# Patient Record
Sex: Female | Born: 1935 | Race: White | Hispanic: No | Marital: Married | Smoking: Never smoker
Health system: Southern US, Community
[De-identification: ages and names within clinical notes are randomized; demographics above are authoritative.]

## PROBLEM LIST (undated history)

## (undated) DIAGNOSIS — F0390 Unspecified dementia without behavioral disturbance: Secondary | ICD-10-CM

## (undated) DIAGNOSIS — I639 Cerebral infarction, unspecified: Secondary | ICD-10-CM

## (undated) DIAGNOSIS — M1990 Unspecified osteoarthritis, unspecified site: Secondary | ICD-10-CM

## (undated) DIAGNOSIS — G459 Transient cerebral ischemic attack, unspecified: Secondary | ICD-10-CM

## (undated) DIAGNOSIS — E7800 Pure hypercholesterolemia, unspecified: Secondary | ICD-10-CM

## (undated) HISTORY — PX: APPENDECTOMY: SHX54

## (undated) HISTORY — DX: Pure hypercholesterolemia, unspecified: E78.00

## (undated) HISTORY — DX: Transient cerebral ischemic attack, unspecified: G45.9

---

## 2000-08-05 ENCOUNTER — Encounter: Payer: Self-pay | Admitting: *Deleted

## 2000-08-05 ENCOUNTER — Emergency Department (HOSPITAL_COMMUNITY): Admission: EM | Admit: 2000-08-05 | Discharge: 2000-08-06 | Payer: Self-pay | Admitting: *Deleted

## 2002-05-24 ENCOUNTER — Encounter: Payer: Self-pay | Admitting: Family Medicine

## 2002-05-24 ENCOUNTER — Ambulatory Visit (HOSPITAL_COMMUNITY): Admission: RE | Admit: 2002-05-24 | Discharge: 2002-05-24 | Payer: Self-pay | Admitting: Family Medicine

## 2003-06-27 ENCOUNTER — Ambulatory Visit (HOSPITAL_COMMUNITY): Admission: RE | Admit: 2003-06-27 | Discharge: 2003-06-27 | Payer: Self-pay | Admitting: Family Medicine

## 2003-12-06 ENCOUNTER — Ambulatory Visit (HOSPITAL_COMMUNITY): Admission: RE | Admit: 2003-12-06 | Discharge: 2003-12-06 | Payer: Self-pay | Admitting: Family Medicine

## 2004-01-23 ENCOUNTER — Ambulatory Visit (HOSPITAL_COMMUNITY): Admission: RE | Admit: 2004-01-23 | Discharge: 2004-01-23 | Payer: Self-pay | Admitting: Otolaryngology

## 2004-09-03 ENCOUNTER — Ambulatory Visit (HOSPITAL_COMMUNITY): Admission: RE | Admit: 2004-09-03 | Discharge: 2004-09-03 | Payer: Self-pay | Admitting: Family Medicine

## 2005-04-10 ENCOUNTER — Ambulatory Visit (HOSPITAL_COMMUNITY): Admission: RE | Admit: 2005-04-10 | Discharge: 2005-04-10 | Payer: Self-pay | Admitting: Family Medicine

## 2005-05-07 ENCOUNTER — Ambulatory Visit (HOSPITAL_COMMUNITY): Admission: RE | Admit: 2005-05-07 | Discharge: 2005-05-07 | Payer: Self-pay | Admitting: General Surgery

## 2006-03-20 ENCOUNTER — Ambulatory Visit (HOSPITAL_COMMUNITY): Admission: RE | Admit: 2006-03-20 | Discharge: 2006-03-20 | Payer: Self-pay | Admitting: Family Medicine

## 2006-03-21 ENCOUNTER — Ambulatory Visit (HOSPITAL_COMMUNITY): Admission: RE | Admit: 2006-03-21 | Discharge: 2006-03-21 | Payer: Self-pay | Admitting: Neurology

## 2007-03-31 ENCOUNTER — Encounter: Admission: RE | Admit: 2007-03-31 | Discharge: 2007-03-31 | Payer: Self-pay | Admitting: Neurology

## 2008-10-07 ENCOUNTER — Encounter: Admission: RE | Admit: 2008-10-07 | Discharge: 2008-10-07 | Payer: Self-pay | Admitting: Neurology

## 2009-05-01 ENCOUNTER — Ambulatory Visit (HOSPITAL_COMMUNITY): Admission: RE | Admit: 2009-05-01 | Discharge: 2009-05-01 | Payer: Self-pay | Admitting: Family Medicine

## 2009-06-02 ENCOUNTER — Emergency Department (HOSPITAL_COMMUNITY): Admission: EM | Admit: 2009-06-02 | Discharge: 2009-06-03 | Payer: Self-pay | Admitting: Internal Medicine

## 2010-04-22 ENCOUNTER — Encounter: Payer: Self-pay | Admitting: Family Medicine

## 2010-06-22 LAB — D-DIMER, QUANTITATIVE: D-Dimer, Quant: 0.37 ug/mL-FEU (ref 0.00–0.48)

## 2010-06-22 LAB — DIFFERENTIAL
Eosinophils Absolute: 0.2 10*3/uL (ref 0.0–0.7)
Eosinophils Relative: 1 % (ref 0–5)
Lymphs Abs: 2.8 10*3/uL (ref 0.7–4.0)
Monocytes Relative: 9 % (ref 3–12)
Neutro Abs: 9.4 10*3/uL — ABNORMAL HIGH (ref 1.7–7.7)
Neutrophils Relative %: 69 % (ref 43–77)

## 2010-06-22 LAB — CBC
MCHC: 34.7 g/dL (ref 30.0–36.0)
MCV: 99.8 fL (ref 78.0–100.0)
Platelets: 255 10*3/uL (ref 150–400)
RDW: 13.1 % (ref 11.5–15.5)

## 2010-06-22 LAB — BASIC METABOLIC PANEL
Chloride: 106 mEq/L (ref 96–112)
GFR calc non Af Amer: >60 mL/min (ref >60)
Glucose, Bld: 96 mg/dL (ref 70–99)
Sodium: 141 mEq/L (ref 135–145)

## 2010-06-22 LAB — PROTIME-INR
INR: 1.02 (ref 0.00–1.49)
Prothrombin Time: 13.3 seconds (ref 11.6–15.2)

## 2010-06-22 LAB — POCT CARDIAC MARKERS
CKMB, poc: <1 ng/mL — ABNORMAL LOW (ref 1.0–8.0)
Myoglobin, poc: 82.1 ng/mL (ref 12–200)
Troponin i, poc: <0.05 ng/mL (ref 0.00–0.09)

## 2010-08-17 NOTE — H&P (Signed)
[name], [name]               ACCOUNT NO.:  [id number]   MEDICAL RECORD NO.:  [id number]          PATIENT TYPE:  AMB   LOCATION:                                FACILITY:  [facility]   PHYSICIAN:  Dalia Heading, M.D.  DATE OF BIRTH:  1933/03/01   DATE OF ADMISSION:  DATE OF DISCHARGE:  LH                                HISTORY & PHYSICAL   CHIEF COMPLAINT:  Colon polyp.   HISTORY OF PRESENT ILLNESS:  The patient is a 77 year old white female who  is referred for endoscopic evaluation.  She needs colonoscopy for follow up  of possible colon mass of the splenic flexure.  This was seen on x-ray.  No  abdominal pain, weight loss, nausea, vomiting, diarrhea, constipation,  melena, hematochezia have been noted.  She has never had a full colonoscopy.  There is no family history of colon carcinoma.   PAST MEDICAL HISTORY:  Includes hypertension.   PAST SURGICAL HISTORY:  Noncontributory.   CURRENT MEDICATIONS:  Estrogen supplements, blood pressure pill.   ALLERGIES:  No known drug allergies.   REVIEW OF SYSTEMS:  Noncontributory.   PHYSICAL EXAMINATION:  GENERAL APPEARANCE:  The patient is a well-developed,  well-nourished white female in no acute distress.  LUNGS:  Clear to auscultation with equal breath sounds bilaterally.  HEART:  Examination reveals a regular rate and rhythm without S3, S4 or  murmurs.  ABDOMEN:  Soft, nontender, nondistended.  No hepatosplenomegaly or masses  are noted.  Rectal examination is deferred to the procedure.   IMPRESSION:  Colon polyp.   PLAN:  Patient is scheduled for a colonoscopy on May 07, 2005.  The  risks and benefits of the procedure including bleeding and perforation were  fully explained to the patient, who gave informed consent.      Dalia Heading, M.D.  Electronically Signed     MAJ/MEDQ  D:  04/25/2005  T:  04/25/2005  Job:  [id number]   cc:   [facility]  Fax: [phone]   Kirk Ruths, M.D.  Fax:  [phone]

## 2010-09-01 ENCOUNTER — Emergency Department (HOSPITAL_COMMUNITY)
Admission: EM | Admit: 2010-09-01 | Discharge: 2010-09-01 | Discharge status: Against Medical Advice | Payer: Medicare Other | Attending: Emergency Medicine | Admitting: Emergency Medicine

## 2010-09-01 DIAGNOSIS — R079 Chest pain, unspecified: Secondary | ICD-10-CM | POA: Insufficient documentation

## 2010-09-01 DIAGNOSIS — R42 Dizziness and giddiness: Secondary | ICD-10-CM | POA: Insufficient documentation

## 2011-01-09 ENCOUNTER — Other Ambulatory Visit (HOSPITAL_COMMUNITY): Payer: Self-pay | Admitting: Family Medicine

## 2011-01-09 DIAGNOSIS — Z139 Encounter for screening, unspecified: Secondary | ICD-10-CM

## 2011-05-21 ENCOUNTER — Ambulatory Visit (HOSPITAL_COMMUNITY)
Admission: RE | Admit: 2011-05-21 | Discharge: 2011-05-21 | Disposition: A | Discharge status: Home / Self Care | Payer: PRIVATE HEALTH INSURANCE | Source: Ambulatory Visit | Attending: Family Medicine | Admitting: Family Medicine

## 2011-05-21 ENCOUNTER — Other Ambulatory Visit (HOSPITAL_COMMUNITY): Payer: Self-pay | Admitting: Family Medicine

## 2011-05-21 DIAGNOSIS — Z01419 Encounter for gynecological examination (general) (routine) without abnormal findings: Secondary | ICD-10-CM

## 2011-05-21 DIAGNOSIS — M818 Other osteoporosis without current pathological fracture: Secondary | ICD-10-CM | POA: Insufficient documentation

## 2011-05-21 DIAGNOSIS — Z13820 Encounter for screening for osteoporosis: Secondary | ICD-10-CM | POA: Insufficient documentation

## 2011-05-21 DIAGNOSIS — Z139 Encounter for screening, unspecified: Secondary | ICD-10-CM

## 2012-06-01 ENCOUNTER — Ambulatory Visit (HOSPITAL_COMMUNITY)
Admission: RE | Admit: 2012-06-01 | Discharge: 2012-06-01 | Disposition: A | Discharge status: Home / Self Care | Payer: Medicare Other | Source: Ambulatory Visit | Attending: Family Medicine | Admitting: Family Medicine

## 2012-06-01 ENCOUNTER — Other Ambulatory Visit (HOSPITAL_COMMUNITY): Payer: Self-pay | Admitting: Family Medicine

## 2012-06-01 DIAGNOSIS — M549 Dorsalgia, unspecified: Secondary | ICD-10-CM

## 2012-06-01 DIAGNOSIS — M810 Age-related osteoporosis without current pathological fracture: Secondary | ICD-10-CM | POA: Insufficient documentation

## 2012-07-15 ENCOUNTER — Other Ambulatory Visit: Payer: Self-pay

## 2012-07-15 MED ORDER — ASPIRIN-DIPYRIDAMOLE ER 25-200 MG PO CP12: 1.0000 | ORAL_CAPSULE | Freq: Two times a day (BID) | ORAL | Status: DC

## 2012-07-15 NOTE — Telephone Encounter (Signed)
[facility].  Dr Eusebio Me

## 2012-08-17 ENCOUNTER — Emergency Department (HOSPITAL_COMMUNITY)
Admission: EM | Admit: 2012-08-17 | Discharge: 2012-08-17 | Disposition: A | Discharge status: Home / Self Care | Payer: Medicare Other | Attending: Emergency Medicine | Admitting: Emergency Medicine

## 2012-08-17 ENCOUNTER — Emergency Department (HOSPITAL_COMMUNITY): Payer: Medicare Other

## 2012-08-17 ENCOUNTER — Encounter (HOSPITAL_COMMUNITY): Payer: Self-pay | Admitting: *Deleted

## 2012-08-17 DIAGNOSIS — Z79899 Other long term (current) drug therapy: Secondary | ICD-10-CM | POA: Insufficient documentation

## 2012-08-17 DIAGNOSIS — Z8673 Personal history of transient ischemic attack (TIA), and cerebral infarction without residual deficits: Secondary | ICD-10-CM | POA: Insufficient documentation

## 2012-08-17 DIAGNOSIS — R6884 Jaw pain: Secondary | ICD-10-CM | POA: Insufficient documentation

## 2012-08-17 DIAGNOSIS — R079 Chest pain, unspecified: Secondary | ICD-10-CM | POA: Insufficient documentation

## 2012-08-17 HISTORY — DX: Cerebral infarction, unspecified: I63.9

## 2012-08-17 LAB — CBC WITH DIFFERENTIAL/PLATELET
Basophils Absolute: 0.1 10*3/uL (ref 0.0–0.1)
Basophils Relative: 1 % (ref 0–1)
Eosinophils Absolute: 0.1 10*3/uL (ref 0.0–0.7)
Eosinophils Relative: 1 % (ref 0–5)
HCT: 39.7 % (ref 36.0–46.0)
MCHC: 32.5 g/dL (ref 30.0–36.0)
Monocytes Absolute: 0.6 10*3/uL (ref 0.1–1.0)
Neutro Abs: 3.5 10*3/uL (ref 1.7–7.7)
RDW: 13 % (ref 11.5–15.5)

## 2012-08-17 LAB — BASIC METABOLIC PANEL
BUN: 14 mg/dL (ref 6–23)
Calcium: 10 mg/dL (ref 8.4–10.5)
Creatinine, Ser: 0.84 mg/dL (ref 0.50–1.10)
GFR calc Af Amer: 76 mL/min — ABNORMAL LOW (ref >90)
GFR calc non Af Amer: 65 mL/min — ABNORMAL LOW (ref >90)
Potassium: 4.1 mEq/L (ref 3.5–5.1)

## 2012-08-17 NOTE — ED Notes (Signed)
Pt with mid CP x 2 days, denies at this time, denies SOB, N/V or diaphoresis, denies taking ASA

## 2012-08-17 NOTE — ED Provider Notes (Signed)
History    This chart was scribed for Benny Lennert, MD by Marlyne Beards, ED Scribe. The patient was seen in room [facility]. Patient's care was started at 3:41 PM.    CSN: [id number]  Arrival date & time 08/17/12  1517   First MD Initiated Contact with Patient 08/17/12 1541      Chief Complaint  Patient presents with  . Chest Pain    (Consider location/radiation/quality/duration/timing/severity/associated sxs/prior treatment) Patient is a 79 y.o. female presenting with chest pain. The history is provided by the patient. No language interpreter was used.  Chest Pain Pain location:  Substernal area Pain quality: dull   Pain severity:  Moderate Duration:  2 days Timing:  Intermittent Associated symptoms: no abdominal pain, no back pain, no cough, no fatigue and no headache    HPI Comments: [name] is a 79 y.o. female with h/o stroke who presents to the Emergency Department complaining of moderate intermittent chest pain which has been going on for the past 2 days. Pt states that it is a dull pain that seems to occur more often at night. She states that nothing seems to trigger the pain it just randomly occurs. Pt states she has been taking some Alka Seltzer for pain with no immediate relief. Pt also complains of pain in her right jaw. Pt denies fever, chills, cough, nausea, vomiting, diarrhea, SOB, weakness, and any other associated symptoms. Pt went to her PCP today and an EKG was taken with normal findings. Pt had a stress test administered a couple years ago. Pt's current PCP is Dr. Regino Schultze.   Past Medical History  Diagnosis Date  . Stroke     History reviewed. No pertinent past surgical history.  History reviewed. No pertinent family history.  History  Substance Use Topics  . Smoking status: Never Smoker   . Smokeless tobacco: Not on file  . Alcohol Use: No    OB History   Grav Para Term Preterm Abortions TAB SAB Ect Mult Living                  Review  of Systems  Constitutional: Negative for appetite change and fatigue.  HENT: Negative for congestion, sinus pressure and ear discharge.   Eyes: Negative for discharge.  Respiratory: Negative for cough.   Cardiovascular: Positive for chest pain.  Gastrointestinal: Negative for abdominal pain and diarrhea.  Genitourinary: Negative for frequency and hematuria.  Musculoskeletal: Negative for back pain.  Skin: Negative for rash.  Neurological: Negative for seizures and headaches.  Psychiatric/Behavioral: Negative for hallucinations.    Allergies  Review of patient's allergies indicates no known allergies.  Home Medications   Current Outpatient Rx  Name  Route  Sig  Dispense  Refill  . dipyridamole-aspirin (AGGRENOX) 200-25 MG per 12 hr capsule   Oral   Take 1 capsule by mouth 2 (two) times daily.   180 capsule   1     BP 151/47  Pulse 74  Temp(Src) 97.3 F (36.3 C) (Oral)  Resp 16  Ht 5\' 4"  (1.626 m)  Wt 114 lb (51.71 kg)  BMI 19.56 kg/m2  SpO2 100%  Physical Exam  Nursing note and vitals reviewed. Constitutional: She is oriented to person, place, and time. She appears well-developed.  HENT:  Head: Normocephalic.  Eyes: Conjunctivae and EOM are normal. No scleral icterus.  Neck: Neck supple. No thyromegaly present.  Cardiovascular: Normal rate and regular rhythm.  Exam reveals no gallop and no friction rub.  No murmur heard. Pulmonary/Chest: No stridor. She has no wheezes. She has no rales. She exhibits no tenderness.  Abdominal: She exhibits no distension. There is no tenderness. There is no rebound.  Musculoskeletal: Normal range of motion. She exhibits no edema.  Lymphadenopathy:    She has no cervical adenopathy.  Neurological: She is oriented to person, place, and time. Coordination normal.  Skin: No rash noted. No erythema.  Psychiatric: She has a normal mood and affect. Her behavior is normal.    ED Course  Procedures (including critical care  time) DIAGNOSTIC STUDIES: Oxygen Saturation is 100% on room air, normal by my interpretation.    COORDINATION OF CARE: 4:04 PM Discussed ED treatment with pt and pt agrees.  5:30 PM Discussed lab and x-ray results with pt and pt agrees. Advised pt to schedule an appointment for another stress test in the near future.    Labs Reviewed  BASIC METABOLIC PANEL - Abnormal; Notable for the following:    GFR calc non Af Amer 65 (*)    GFR calc Af Amer 76 (*)    All other components within normal limits  CBC WITH DIFFERENTIAL  TROPONIN I   Dg Chest Portable 1 View  08/17/2012   *RADIOLOGY REPORT*  Clinical Data: Chest pain  PORTABLE CHEST - 1 VIEW  Comparison: 05/01/2009  Findings: The heart and pulmonary vascularity are within normal limits.  The lungs are clear bilaterally.  No acute bony abnormality is noted.  IMPRESSION: No acute abnormality noted.   Original Report Authenticated By: Alcide Clever, M.D.     No diagnosis found.   Date: 08/17/2012  Rate: 72  Rhythm: normal sinus rhythm  QRS Axis: normal  Intervals: normal  ST/T Wave abnormalities: normal  Conduction Disutrbances:none  Narrative Interpretation:   Old EKG Reviewed: none available    MDM  Chest pain,  Normal troponin,  No pain in er,  Will have pt follow up     The chart was scribed for me under my direct supervision.  I personally performed the history, physical, and medical decision making and all procedures in the evaluation of this patient.Benny Lennert, MD 08/17/12 [phone]

## 2012-08-17 NOTE — ED Notes (Signed)
Chest  Pain for 2 days, Pain in rt jaw.Sent from Dr Edison Simon office.

## 2013-11-15 ENCOUNTER — Ambulatory Visit: Payer: Self-pay | Admitting: Family Medicine

## 2013-12-29 ENCOUNTER — Observation Stay: Payer: Self-pay | Admitting: Internal Medicine

## 2013-12-29 LAB — CBC WITH DIFFERENTIAL/PLATELET
BASOS ABS: 0 10*3/uL (ref 0.0–0.1)
BASOS PCT: 0.2 %
EOS ABS: 0.1 10*3/uL (ref 0.0–0.7)
Eosinophil %: 0.5 %
HCT: 36.5 % (ref 35.0–47.0)
HGB: 11.5 g/dL — AB (ref 12.0–16.0)
LYMPHS PCT: 12.7 %
Lymphocyte #: 1.7 10*3/uL (ref 1.0–3.6)
MCH: 29.6 pg (ref 26.0–34.0)
MCHC: 31.4 g/dL — AB (ref 32.0–36.0)
MCV: 94 fL (ref 80–100)
Monocyte #: 0.4 x10 3/mm (ref 0.2–0.9)
Monocyte %: 3.2 %
Neutrophil #: 11.5 10*3/uL — ABNORMAL HIGH (ref 1.4–6.5)
Neutrophil %: 83.4 %
PLATELETS: 263 10*3/uL (ref 150–440)
RBC: 3.87 10*6/uL (ref 3.80–5.20)
RDW: 13.6 % (ref 11.5–14.5)
WBC: 13.7 10*3/uL — AB (ref 3.6–11.0)

## 2013-12-29 LAB — COMPREHENSIVE METABOLIC PANEL
ALBUMIN: 3.4 g/dL (ref 3.4–5.0)
ANION GAP: 9 (ref 7–16)
AST: 40 U/L — AB (ref 15–37)
Alkaline Phosphatase: 33 U/L — ABNORMAL LOW
BUN: 18 mg/dL (ref 7–18)
Bilirubin,Total: 0.3 mg/dL (ref 0.2–1.0)
CREATININE: 1.2 mg/dL (ref 0.60–1.30)
Calcium, Total: 9 mg/dL (ref 8.5–10.1)
Chloride: 106 mmol/L (ref 98–107)
Co2: 25 mmol/L (ref 21–32)
EGFR (African American): 56 — ABNORMAL LOW
EGFR (Non-African Amer.): 46 — ABNORMAL LOW
Glucose: 123 mg/dL — ABNORMAL HIGH (ref 65–99)
OSMOLALITY: 283 (ref 275–301)
Potassium: 3.9 mmol/L (ref 3.5–5.1)
SGPT (ALT): 26 U/L
SODIUM: 140 mmol/L (ref 136–145)
TOTAL PROTEIN: 7 g/dL (ref 6.4–8.2)

## 2013-12-29 LAB — LIPASE, BLOOD: Lipase: 147 U/L (ref 73–393)

## 2014-01-03 LAB — CULTURE, BLOOD (SINGLE)

## 2014-02-10 ENCOUNTER — Ambulatory Visit: Payer: Self-pay | Admitting: Family Medicine

## 2014-07-23 NOTE — H&P (Signed)
PATIENT NAME:  [name], [name] MR#:  [id number] DATE OF BIRTH:  24-Jan-1933  DATE OF ADMISSION:  12/29/2013  REFERRING PHYSICIAN: Dr. Marjean Donna.   PRIMARY CARE DOCTOR: Dr. Elsie Lincoln.  ADMITTING DIAGNOSIS: Pneumonia.   HISTORY OF PRESENT ILLNESS: This is a 81 year old Caucasian female who presents to the hospital complaining of cough. She states that she developed a scratchy throat nearly 1 week ago and has been having some subjective fevers. She has been coughing badly with scant amount of phlegm production. Today she felt chills and had 1 episode of nonbloody, nonbilious emesis, which prompted her to come to the Emergency Department. Chest x-ray showed a right middle lobe pneumonia and the patient was also found to have persistent tachycardia, which prompted the ED to call for admission.   REVIEW OF SYSTEMS: CONSTITUTIONAL: The patient admits to subjective fever and generalized malaise today.  EYES: Denies blurred vision or inflammation.  EARS, NOSE AND THROAT: Denies tinnitus, but admits to sore throat.  RESPIRATORY: Admits to cough and some shortness of breath.  CARDIOVASCULAR: Admits to chest pain associated with the cough, but denies orthopnea, paroxysmal nocturnal dyspnea or dyspnea on exertion.  GASTROINTESTINAL: The patient admits to 1 episode of nausea but denies abdominal pain or diarrhea.  GENITOURINARY: Denies dysuria, increased frequency or hesitancy.  ENDOCRINE: Denies polyuria or nocturia.  HEMATOLOGIC AND LYMPHATIC: The patient denies easy bruising or bleeding.  INTEGUMENT: The patient denies rashes or lesions.  MUSCULOSKELETAL: The patient denies arthralgias or myalgias.  NEUROLOGIC: The patient denies numbness in her extremities or dysarthria.  PSYCHIATRIC: The patient denies depression or suicidal ideation.   PAST MEDICAL HISTORY: Hyperlipidemia and remote history of stroke.   PAST SURGICAL HISTORY: The patient has had hemorrhoid removal as well as an  appendectomy.   SOCIAL HISTORY: The patient is married with two healthy daughters. She does not smoke, drink or do any drugs.   FAMILY HISTORY: Both of her parents and her sister died of stroke.   MEDICATIONS:  1. Aggrenox 25 mg/200 mg extended release capsule, 1 capsule p.o. b.i.d.  2. Simvastatin 40 mg 1 tablet p.o. at bedtime.   ALLERGIES: NO KNOWN DRUG ALLERGIES.   PERTINENT LABORATORY RESULTS AND RADIOGRAPHIC FINDINGS:. White blood cell count is 13.7, hemoglobin 11.5, hematocrit 36.5, BUN 18, creatinine 1.2, sodium 140, potassium 3.9, chloride 106, bicarbonate 25, calcium 9, serum albumin is 3.4, alkaline phosphatase 33.8, AST is 48, ALT is 26. Chest x-ray shows right middle lobe consolidation as well as COPD and osteopenia with mild degenerative changes of the thoracic spine. The patient's lactic acid is 2.5.   PHYSICAL EXAMINATION:  VITAL SIGNS: Temperature 100.6, pulse 106, respirations 18, blood pressure 191/79, pulse oximetry 94% on room air.  GENERAL: The patient is alert and oriented x 3 in no apparent distress.  HEENT: Normocephalic, atraumatic. Pupils equal, round, and reactive to light and accommodation. Extraocular movements are intact. Mucous membranes are moist.  NECK: Trachea is midline. No adenopathy.  CHEST: Symmetric and atraumatic.  CARDIOVASCULAR: Tachycardic with normal S1, S2. No rubs, clicks, or murmurs appreciated.  LUNGS: Slightly diminished breath sounds in the right anterior chest. There is no use of accessory muscles. There are no rhonchi or rales.  ABDOMEN: Positive bowel sounds. Soft, nontender, nondistended. No hepatosplenomegaly.  GENITOURINARY: Deferred.  MUSCULOSKELETAL: The patient moves all 4 extremities equally and has 5/5 strength in upper and lower extremities bilaterally.  SKIN: No rashes or lesions.  EXTREMITIES: No clubbing, cyanosis, or edema.  NEUROLOGIC: Cranial nerves II  through XII are grossly intact.  PSYCHIATRIC: Mood is normal. Affect  is congruent.   ASSESSMENT AND PLAN: This is a 81 year old female admitted for pneumonia.   1. Pneumonia. This is community-acquired pneumonia. The patient has been started on IV Levaquin. We will continue her on continuous pulse oximetry for now as we monitor her heart rate and respiratory rate. She does not have an oxygen requirement.  2. Tachycardia persistent despite a fluid bolus. We started the patient on maintenance fluid and will continue to monitor her throughout the night.  3. Hyperlipidemia. 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 We will continue the patient on Aggrenox and simvastatin for stroke prevention.  4. Deep vein thrombosis prophylaxis. Sequential compression devices.  5. Gastrointestinal prophylaxis is not necessary as the patient is not critically ill.   CODE STATUS: The patient is a full code.   TIME SPENT ON ADMISSION ORDERS AND PATIENT CARE: Approximately 35 minutes.     ____________________________ Norva Riffle. Marcille Blanco, MD msd:JT D: 12/29/2013 03:12:21 ET T: 12/29/2013 03:32:40 ET JOB#: [id number]  cc: Norva Riffle. Marcille Blanco, MD, <Dictator> Norva Riffle Wandy Bossler MD ELECTRONICALLY SIGNED 12/29/2013 7:16

## 2014-07-23 NOTE — Discharge Summary (Signed)
PATIENT NAMEMARYHELEN, [name] MR#:  [id number] DATE OF BIRTH:  1932/04/10  DATE OF ADMISSION:  12/29/2013 DATE OF DISCHARGE:  12/29/2013  ADMITTING DIAGNOSIS: Chills, nonbloody bilious emesis.   DISCHARGE DIAGNOSES: 1.  Chills, nonbloody emesis related to community-acquired pneumonia, now patient doing much better after IV hydration.  2.  Sinus tachycardia related to pneumonia. Heart rate stable at discharge.  3.  Hyperlipidemia.  4.  Peripheral vascular disease.  5.  History of previous cerebrovascular accident.  CONSULTANTS: None.   PERTINENT LABS AND EVALUATIONS: Admitting WBC count 13.7, hemoglobin 11.5, platelet count was normal. BUN 18, creatinine 1.2, sodium 140, potassium 3.9.   Chest x-ray showed right middle lobe consolidation as well as COPD and osteopenia with mild degenerative changes.   Lactic acid was 2.5.   HOSPITAL COURSE: Please refer to H and P done by the admitting physician. The patient is a 81 year old white female who presented to the hospital complaining of cough and an episode of non-bilious emesis. The patient came to the ED and was evaluated, was noted to have a temperature of 100.6 and heart rate in the 100s. The patient was placed under observation for treatment of community-acquired pneumonia. By the time I saw her, she was doing much better and no further emesis. She was tolerating diet. Her heart rate has also decreased with IV fluids and she is doing much better and very anxious to go home. At this time, she is stable for discharge.   DISCHARGE MEDICATIONS: Aggrenox 1 tab p.o. b.i.d., simvastatin 2 daily as taking previously, acetaminophen 650 q. 4 p.r.n. for pain, Levaquin 500 one tab q. 24 hours x6 more days.   DISCHARGE DIET: Low-sodium, low-fat, low-cholesterol.   DISCHARGE ACTIVITY: As tolerated.   DISCHARGE FOLLOWUP: With primary MD in 1 to 2 weeks.  TIME SPENT: 35 minutes.  ____________________________ Lafonda Mosses Posey Pronto,  MD shp:sb D: 12/31/2013 08:30:35 ET T: 12/31/2013 11:25:43 ET JOB#: [id number]  cc: Shaniquia Brafford H. Posey Pronto, MD, <Dictator> Alric Seton MD ELECTRONICALLY SIGNED 01/09/2014 8:54

## 2014-11-17 NOTE — Patient Instructions (Signed)
[name]  11/17/2014     @PREFPERIOPPHARMACY @   Your procedure is scheduled on 11/22/2014.  Report to [facility] at 9:30 A.M.  Call this number if you have problems the morning of surgery:  [phone]   Remember:  Do not eat food or drink liquids after midnight.  Take these medicines the morning of surgery with A SIP OF WATER None   Do not wear jewelry, make-up or nail polish.  Do not wear lotions, powders, or perfumes.  You may wear deodorant.  Do not shave 48 hours prior to surgery.  Men may shave face and neck.  Do not bring valuables to the hospital.  [facility] is not responsible for any belongings or valuables.  Contacts, dentures or bridgework may not be worn into surgery.  Leave your suitcase in the car.  After surgery it may be brought to your room.  For patients admitted to the hospital, discharge time will be determined by your treatment team.  Patients discharged the day of surgery will not be allowed to drive home.    Please read over the following fact sheets that you were given. Anesthesia Post-op Instructions     PATIENT INSTRUCTIONS POST-ANESTHESIA  IMMEDIATELY FOLLOWING SURGERY:  Do not drive or operate machinery for the first twenty four hours after surgery.  Do not make any important decisions for twenty four hours after surgery or while taking narcotic pain medications or sedatives.  If you develop intractable nausea and vomiting or a severe headache please notify your doctor immediately.  FOLLOW-UP:  Please make an appointment with your surgeon as instructed. You do not need to follow up with anesthesia unless specifically instructed to do so.  WOUND CARE INSTRUCTIONS (if applicable):  Keep a dry clean dressing on the anesthesia/puncture wound site if there is drainage.  Once the wound has quit draining you may leave it open to air.  Generally you should leave the bandage intact for twenty four hours unless there is drainage.  If the epidural  site drains for more than 36-48 hours please call the anesthesia department.  QUESTIONS?:  Please feel free to call your physician or the hospital operator if you have any questions, and they will be happy to assist you.      Cataract Surgery  A cataract is a clouding of the lens of the eye. When a lens becomes cloudy, vision is reduced based on the degree and nature of the clouding. Surgery may be needed to improve vision. Surgery removes the cloudy lens and usually replaces it with a substitute lens (intraocular lens, IOL). LET YOUR EYE DOCTOR KNOW ABOUT:  Allergies to food or medicine.  Medicines taken including herbs, eye drops, over-the-counter medicines, and creams.  Use of steroids (by mouth or creams).  Previous problems with anesthetics or numbing medicine.  History of bleeding problems or blood clots.  Previous surgery.  Other health problems, including diabetes and kidney problems.  Possibility of pregnancy, if this applies. RISKS AND COMPLICATIONS  Infection.  Inflammation of the eyeball (endophthalmitis) that can spread to both eyes (sympathetic ophthalmia).  Poor wound healing.  If an IOL is inserted, it can later fall out of proper position. This is very uncommon.  Clouding of the part of your eye that holds an IOL in place. This is called an "after-cataract." These are uncommon but easily treated. BEFORE THE PROCEDURE  Do not eat or drink anything except small amounts of water for 8 to 12 before your surgery,  or as directed by your caregiver.  Unless you are told otherwise, continue any eye drops you have been prescribed.  Talk to your primary caregiver about all other medicines that you take (both prescription and nonprescription). In some cases, you may need to stop or change medicines near the time of your surgery. This is most important if you are taking blood-thinning medicine.Do not stop medicines unless you are told to do so.  Arrange for someone to  drive you to and from the procedure.  Do not put contact lenses in either eye on the day of your surgery. PROCEDURE There is more than one method for safely removing a cataract. Your doctor can explain the differences and help determine which is best for you. Phacoemulsification surgery is the most common form of cataract surgery.  An injection is given behind the eye or eye drops are given to make this a painless procedure.  A small cut (incision) is made on the edge of the clear, dome-shaped surface that covers the front of the eye (cornea).  A tiny probe is painlessly inserted into the eye. This device gives off ultrasound waves that soften and break up the cloudy center of the lens. This makes it easier for the cloudy lens to be removed by suction.  An IOL may be implanted.  The normal lens of the eye is covered by a clear capsule. Part of that capsule is intentionally left in the eye to support the IOL.  Your surgeon may or may not use stitches to close the incision. There are other forms of cataract surgery that require a larger incision and stitches to close the eye. This approach is taken in cases where the doctor feels that the cataract cannot be easily removed using phacoemulsification. AFTER THE PROCEDURE  When an IOL is implanted, it does not need care. It becomes a permanent part of your eye and cannot be seen or felt.  Your doctor will schedule follow-up exams to check on your progress.  Review your other medicines with your doctor to see which can be resumed after surgery.  Use eye drops or take medicine as prescribed by your doctor. Document Released: 03/07/2011 Document Revised: 08/02/2013 Document Reviewed: 03/07/2011 [facility] Patient Information 2015 [place], [place]. This information is not intended to replace advice given to you by your health care provider. Make sure you discuss any questions you have with your health care provider.

## 2014-11-18 ENCOUNTER — Encounter (HOSPITAL_COMMUNITY)
Admission: RE | Admit: 2014-11-18 | Discharge: 2014-11-18 | Disposition: A | Discharge status: Home / Self Care | Payer: Medicare Other | Source: Ambulatory Visit | Attending: Ophthalmology | Admitting: Ophthalmology

## 2014-11-18 ENCOUNTER — Encounter (HOSPITAL_COMMUNITY): Payer: Self-pay

## 2014-11-18 DIAGNOSIS — H2511 Age-related nuclear cataract, right eye: Secondary | ICD-10-CM | POA: Diagnosis not present

## 2014-11-18 DIAGNOSIS — Z01818 Encounter for other preprocedural examination: Secondary | ICD-10-CM | POA: Diagnosis present

## 2014-11-18 LAB — CBC
HCT: 33.6 % — ABNORMAL LOW (ref 36.0–46.0)
Hemoglobin: 10.7 g/dL — ABNORMAL LOW (ref 12.0–15.0)
MCH: 30.3 pg (ref 26.0–34.0)
MCHC: 31.8 g/dL (ref 30.0–36.0)
MCV: 95.2 fL (ref 78.0–100.0)
PLATELETS: 309 10*3/uL (ref 150–400)
RBC: 3.53 MIL/uL — ABNORMAL LOW (ref 3.87–5.11)
RDW: 15.2 % (ref 11.5–15.5)
WBC: 6.9 10*3/uL (ref 4.0–10.5)

## 2014-11-18 LAB — BASIC METABOLIC PANEL
Anion gap: 7 (ref 5–15)
BUN: 17 mg/dL (ref 6–20)
CALCIUM: 9.8 mg/dL (ref 8.9–10.3)
CO2: 27 mmol/L (ref 22–32)
CREATININE: 0.86 mg/dL (ref 0.44–1.00)
Chloride: 105 mmol/L (ref 101–111)
GFR calc Af Amer: >60 mL/min (ref >60)
GLUCOSE: 87 mg/dL (ref 65–99)
Potassium: 4.9 mmol/L (ref 3.5–5.1)
SODIUM: 139 mmol/L (ref 135–145)

## 2014-11-22 ENCOUNTER — Ambulatory Visit (HOSPITAL_COMMUNITY): Payer: Medicare Other | Admitting: Anesthesiology

## 2014-11-22 ENCOUNTER — Encounter (HOSPITAL_COMMUNITY)
Admission: RE | Disposition: A | Discharge status: Home / Self Care | Payer: Self-pay | Source: Ambulatory Visit | Attending: Ophthalmology

## 2014-11-22 ENCOUNTER — Ambulatory Visit (HOSPITAL_COMMUNITY)
Admission: RE | Admit: 2014-11-22 | Discharge: 2014-11-22 | Disposition: A | Discharge status: Home / Self Care | Payer: Medicare Other | Source: Ambulatory Visit | Attending: Ophthalmology | Admitting: Ophthalmology

## 2014-11-22 ENCOUNTER — Encounter (HOSPITAL_COMMUNITY): Payer: Self-pay | Admitting: *Deleted

## 2014-11-22 DIAGNOSIS — E780 Pure hypercholesterolemia: Secondary | ICD-10-CM | POA: Insufficient documentation

## 2014-11-22 DIAGNOSIS — Z7982 Long term (current) use of aspirin: Secondary | ICD-10-CM | POA: Insufficient documentation

## 2014-11-22 DIAGNOSIS — Z8673 Personal history of transient ischemic attack (TIA), and cerebral infarction without residual deficits: Secondary | ICD-10-CM | POA: Diagnosis not present

## 2014-11-22 DIAGNOSIS — Z79899 Other long term (current) drug therapy: Secondary | ICD-10-CM | POA: Insufficient documentation

## 2014-11-22 DIAGNOSIS — H2511 Age-related nuclear cataract, right eye: Secondary | ICD-10-CM | POA: Insufficient documentation

## 2014-11-22 HISTORY — PX: CATARACT EXTRACTION W/PHACO: SHX586

## 2014-11-22 SURGERY — PHACOEMULSIFICATION, CATARACT, WITH IOL INSERTION
Anesthesia: Monitor Anesthesia Care | Site: Eye | Laterality: Right

## 2014-11-22 MED ORDER — EPINEPHRINE HCL 1 MG/ML IJ SOLN
INTRAOCULAR | Status: DC | PRN
  Administered 2014-11-22: 500 mL

## 2014-11-22 MED ORDER — CYCLOPENTOLATE-PHENYLEPHRINE 0.2-1 % OP SOLN
1.0000 [drp] | OPHTHALMIC | Status: AC
  Administered 2014-11-22 (×3): 1 [drp] via OPHTHALMIC

## 2014-11-22 MED ORDER — LACTATED RINGERS IV SOLN
INTRAVENOUS | Status: DC
  Administered 2014-11-22: 11:00:00 via INTRAVENOUS

## 2014-11-22 MED ORDER — KETOROLAC TROMETHAMINE 0.5 % OP SOLN
1.0000 [drp] | OPHTHALMIC | Status: AC
  Administered 2014-11-22 (×3): 1 [drp] via OPHTHALMIC

## 2014-11-22 MED ORDER — FENTANYL CITRATE (PF) 100 MCG/2ML IJ SOLN
INTRAMUSCULAR | Status: AC
  Filled 2014-11-22: qty 2

## 2014-11-22 MED ORDER — TETRACAINE 0.5 % OP SOLN OPTIME - NO CHARGE
OPHTHALMIC | Status: DC | PRN
  Administered 2014-11-22: 1 [drp] via OPHTHALMIC

## 2014-11-22 MED ORDER — PROVISC 10 MG/ML IO SOLN
INTRAOCULAR | Status: DC | PRN
  Administered 2014-11-22: 0.85 mL via INTRAOCULAR

## 2014-11-22 MED ORDER — PHENYLEPHRINE HCL 2.5 % OP SOLN
1.0000 [drp] | OPHTHALMIC | Status: AC
  Administered 2014-11-22 (×3): 1 [drp] via OPHTHALMIC

## 2014-11-22 MED ORDER — TETRACAINE HCL 0.5 % OP SOLN
1.0000 [drp] | OPHTHALMIC | Status: AC
  Administered 2014-11-22 (×3): 1 [drp] via OPHTHALMIC

## 2014-11-22 MED ORDER — MIDAZOLAM HCL 2 MG/2ML IJ SOLN
1.0000 mg | INTRAMUSCULAR | Status: DC | PRN
  Administered 2014-11-22: 2 mg via INTRAVENOUS

## 2014-11-22 MED ORDER — FENTANYL CITRATE (PF) 100 MCG/2ML IJ SOLN
25.0000 ug | INTRAMUSCULAR | Status: AC
  Administered 2014-11-22 (×2): 25 ug via INTRAVENOUS

## 2014-11-22 MED ORDER — BSS IO SOLN
INTRAOCULAR | Status: DC | PRN
  Administered 2014-11-22: 15 mL

## 2014-11-22 MED ORDER — MIDAZOLAM HCL 2 MG/2ML IJ SOLN
INTRAMUSCULAR | Status: AC
  Filled 2014-11-22: qty 2

## 2014-11-22 SURGICAL SUPPLY — 9 items
CLOTH BEACON ORANGE TIMEOUT ST (SAFETY) ×1 IMPLANT
EYE SHIELD UNIVERSAL CLEAR (GAUZE/BANDAGES/DRESSINGS) ×1 IMPLANT
GLOVE BIO SURGEON STRL SZ 6.5 (GLOVE) ×1 IMPLANT
GLOVE EXAM NITRILE MD LF STRL (GLOVE) ×2 IMPLANT
LENS ALC ACRYL/TECN (Ophthalmic Related) ×2 IMPLANT
PAD ARMBOARD 7.5X6 YLW CONV (MISCELLANEOUS) ×1 IMPLANT
TAPE SURG TRANSPORE 1 IN (GAUZE/BANDAGES/DRESSINGS) IMPLANT
TAPE SURGICAL TRANSPORE 1 IN (GAUZE/BANDAGES/DRESSINGS) ×1
WATER STERILE IRR 250ML POUR (IV SOLUTION) ×1 IMPLANT

## 2014-11-22 NOTE — H&P (Signed)
The patient was re examined and there is no change in the patients condition since the original H and P. 

## 2014-11-22 NOTE — Anesthesia Postprocedure Evaluation (Signed)
  Anesthesia Post-op Note  Patient: [name]  Procedure(s) Performed: Procedure(s) with comments: CATARACT EXTRACTION PHACO AND INTRAOCULAR LENS PLACEMENT (IOC) (Right) - CDE:5.05  Patient Location: Short Stay  Anesthesia Type:MAC  Level of Consciousness: awake, alert  and oriented  Airway and Oxygen Therapy: Patient Spontanous Breathing  Post-op Pain: none  Post-op Assessment: Post-op Vital signs reviewed, Patient's Cardiovascular Status Stable, Respiratory Function Stable, Patent Airway and No signs of Nausea or vomiting              Post-op Vital Signs: Reviewed and stable  Last Vitals:  Filed Vitals:   11/22/14 1050  BP: 132/90  Pulse:   Temp:   Resp: 17    Complications: No apparent anesthesia complications

## 2014-11-22 NOTE — Discharge Instructions (Signed)
Cataract Surgery °Care After °Refer to this sheet in the next few weeks. These instructions provide you with information on caring for yourself after your procedure. Your caregiver may also give you more specific instructions. Your treatment has been planned according to current medical practices, but problems sometimes occur. Call your caregiver if you have any problems or questions after your procedure.  °HOME CARE INSTRUCTIONS  °· Avoid strenuous activities as directed by your caregiver. °· Ask your caregiver when you can resume driving. °· Use eyedrops or other medicines to help healing and control pressure inside your eye as directed by your caregiver. °· Only take over-the-counter or prescription medicines for pain, discomfort, or fever as directed by your caregiver. °· Do not to touch or rub your eyes. °· You may be instructed to use a protective shield during the first few days and nights after surgery. If not, wear sunglasses to protect your eyes. This is to protect the eye from pressure or from being accidentally bumped. °· Keep the area around your eye clean and dry. Avoid swimming or allowing water to hit you directly in the face while showering. Keep soap and shampoo out of your eyes. °· Do not bend or lift heavy objects. Bending increases pressure in the eye. You can walk, climb stairs, and do light household chores. °· Do not put a contact lens into the eye that had surgery until your caregiver says it is okay to do so. °· Ask your doctor when you can return to work. This will depend on the kind of work that you do. If you work in a dusty environment, you may be advised to wear protective eyewear for a period of time. °· Ask your caregiver when it will be safe to engage in sexual activity. °· Continue with your regular eye exams as directed by your caregiver. °What to expect: °· It is normal to feel itching and mild discomfort for a few days after cataract surgery. Some fluid discharge is also common,  and your eye may be sensitive to light and touch. °· After 1 to 2 days, even moderate discomfort should disappear. In most cases, healing will take about 6 weeks. °· If you received an intraocular lens (IOL), you may notice that colors are very bright or have a blue tinge. Also, if you have been in bright sunlight, everything may appear reddish for a few hours. If you see these color tinges, it is because your lens is clear and no longer cloudy. Within a few months after receiving an IOL, these extra colors should go away. When you have healed, you will probably need new glasses. °SEEK MEDICAL CARE IF:  °· You have increased bruising around your eye. °· You have discomfort not helped by medicine. °SEEK IMMEDIATE MEDICAL CARE IF:  °· You have a  fever. °· You have a worsening or sudden vision loss. °· You have redness, swelling, or increasing pain in the eye. °· You have a thick discharge from the eye that had surgery. °MAKE SURE YOU: °· Understand these instructions. °· Will watch your condition. °· Will get help right away if you are not doing well or get worse. °Document Released: 10/05/2004 Document Revised: 06/10/2011 Document Reviewed: 11/09/2010 °Exit[employer]® Patient Information ©2015 [employer]. This information is not intended to replace advice given to you by your health care provider. Make sure you discuss any questions you have with your health care provider. ° °

## 2014-11-22 NOTE — Op Note (Signed)
Patient brought to the operating room and prepped and draped in the usual manner. Lid speculum inserted in right eye. Stab incision made at the twelve o'clock position. Provisc instilled in the anterior chamber. A 2.4 mm. Stab incision was made temporally. An anterior capsulotomy was done with a bent 25 gauge needle. The nucleus was hydrodissected. The Phaco tip was inserted in the anterior chamber and the nucleus was emulsified. CDE was 5.05. The cortical material was then removed with the I and A tip. Posterior capsule was the polished. The anterior chamber was deepened with Provisc. A 21.0 Diopter Alcon [id number] IOL was then inserted in the capsular bag. Provisc was then removed with the I and A tip. The wound was then hydrated. Patient sent to the Recovery Room in good condition with follow up in my office.  Preoperative Diagnosis: Nuclear Cataract OD Postoperative Diagnosis: Same  Procedure name: Kelman Phacoemulsification OD with IOL

## 2014-11-22 NOTE — Transfer of Care (Signed)
Immediate Anesthesia Transfer of Care Note  Patient: [name]  Procedure(s) Performed: Procedure(s) with comments: CATARACT EXTRACTION PHACO AND INTRAOCULAR LENS PLACEMENT (IOC) (Right) - CDE:5.05  Patient Location: Short Stay  Anesthesia Type:MAC  Level of Consciousness: awake  Airway & Oxygen Therapy: Patient Spontanous Breathing  Post-op Assessment: Report given to RN  Post vital signs: Reviewed  Last Vitals:  Filed Vitals:   11/22/14 1050  BP: 132/90  Pulse:   Temp:   Resp: 17    Complications: No apparent anesthesia complications

## 2014-11-22 NOTE — Anesthesia Preprocedure Evaluation (Signed)
Anesthesia Evaluation  Patient identified by MRN, date of birth, ID band Patient awake    Reviewed: Allergy & Precautions, NPO status , Patient's Chart, lab work & pertinent test results  Airway Mallampati: II  TM Distance: >3 FB     Dental  (+) Partial Lower, Partial Upper   Pulmonary neg pulmonary ROS,  breath sounds clear to auscultation        Cardiovascular negative cardio ROS  Rhythm:Regular Rate:Normal     Neuro/Psych CVA, Residual Symptoms    GI/Hepatic negative GI ROS,   Endo/Other    Renal/GU      Musculoskeletal   Abdominal   Peds  Hematology   Anesthesia Other Findings   Reproductive/Obstetrics                             Anesthesia Physical Anesthesia Plan  ASA: III  Anesthesia Plan: MAC   Post-op Pain Management:    Induction: Intravenous  Airway Management Planned: Nasal Cannula  Additional Equipment:   Intra-op Plan:   Post-operative Plan:   Informed Consent: I have reviewed the patients History and Physical, chart, labs and discussed the procedure including the risks, benefits and alternatives for the proposed anesthesia with the patient or authorized representative who has indicated his/her understanding and acceptance.     Plan Discussed with:   Anesthesia Plan Comments:         Anesthesia Quick Evaluation

## 2014-11-23 ENCOUNTER — Encounter (HOSPITAL_COMMUNITY): Payer: Self-pay | Admitting: Ophthalmology

## 2014-12-08 ENCOUNTER — Encounter (HOSPITAL_COMMUNITY): Admission: RE | Admit: 2014-12-08 | Payer: Medicare Other | Source: Ambulatory Visit

## 2014-12-12 MED ORDER — CYCLOPENTOLATE-PHENYLEPHRINE OP SOLN OPTIME - NO CHARGE
OPHTHALMIC | Status: AC
  Filled 2014-12-12: qty 2

## 2014-12-12 MED ORDER — PHENYLEPHRINE HCL 2.5 % OP SOLN
OPHTHALMIC | Status: AC
  Filled 2014-12-12: qty 15

## 2014-12-12 MED ORDER — TETRACAINE HCL 0.5 % OP SOLN
OPHTHALMIC | Status: AC
  Filled 2014-12-12: qty 2

## 2014-12-12 MED ORDER — KETOROLAC TROMETHAMINE 0.5 % OP SOLN
OPHTHALMIC | Status: AC
  Filled 2014-12-12: qty 5

## 2014-12-13 ENCOUNTER — Ambulatory Visit (HOSPITAL_COMMUNITY)
Admission: RE | Admit: 2014-12-13 | Discharge: 2014-12-13 | Disposition: A | Discharge status: Home / Self Care | Payer: Medicare Other | Source: Ambulatory Visit | Attending: Ophthalmology | Admitting: Ophthalmology

## 2014-12-13 ENCOUNTER — Ambulatory Visit (HOSPITAL_COMMUNITY): Payer: Medicare Other | Admitting: Anesthesiology

## 2014-12-13 ENCOUNTER — Encounter (HOSPITAL_COMMUNITY): Payer: Self-pay | Admitting: *Deleted

## 2014-12-13 ENCOUNTER — Encounter (HOSPITAL_COMMUNITY)
Admission: RE | Disposition: A | Discharge status: Home / Self Care | Payer: Self-pay | Source: Ambulatory Visit | Attending: Ophthalmology

## 2014-12-13 DIAGNOSIS — H2512 Age-related nuclear cataract, left eye: Secondary | ICD-10-CM | POA: Diagnosis not present

## 2014-12-13 DIAGNOSIS — E780 Pure hypercholesterolemia: Secondary | ICD-10-CM | POA: Insufficient documentation

## 2014-12-13 DIAGNOSIS — Z8673 Personal history of transient ischemic attack (TIA), and cerebral infarction without residual deficits: Secondary | ICD-10-CM | POA: Insufficient documentation

## 2014-12-13 DIAGNOSIS — Z79899 Other long term (current) drug therapy: Secondary | ICD-10-CM | POA: Diagnosis not present

## 2014-12-13 HISTORY — PX: CATARACT EXTRACTION W/PHACO: SHX586

## 2014-12-13 SURGERY — PHACOEMULSIFICATION, CATARACT, WITH IOL INSERTION
Anesthesia: Monitor Anesthesia Care | Site: Eye | Laterality: Left

## 2014-12-13 MED ORDER — CYCLOPENTOLATE-PHENYLEPHRINE 0.2-1 % OP SOLN
1.0000 [drp] | OPHTHALMIC | Status: AC
  Administered 2014-12-13 (×3): 1 [drp] via OPHTHALMIC

## 2014-12-13 MED ORDER — LACTATED RINGERS IV SOLN
INTRAVENOUS | Status: DC
  Administered 2014-12-13: 10:00:00 via INTRAVENOUS

## 2014-12-13 MED ORDER — ONDANSETRON HCL 4 MG/2ML IJ SOLN: 4.0000 mg | Freq: Once | INTRAMUSCULAR | Status: DC | PRN

## 2014-12-13 MED ORDER — TETRACAINE HCL 0.5 % OP SOLN
1.0000 [drp] | OPHTHALMIC | Status: AC
  Administered 2014-12-13 (×3): 1 [drp] via OPHTHALMIC

## 2014-12-13 MED ORDER — KETOROLAC TROMETHAMINE 0.5 % OP SOLN
1.0000 [drp] | OPHTHALMIC | Status: AC
  Administered 2014-12-13 (×3): 1 [drp] via OPHTHALMIC

## 2014-12-13 MED ORDER — TETRACAINE 0.5 % OP SOLN OPTIME - NO CHARGE
OPHTHALMIC | Status: DC | PRN
  Administered 2014-12-13: 1 [drp] via OPHTHALMIC

## 2014-12-13 MED ORDER — BSS IO SOLN
INTRAOCULAR | Status: DC | PRN
  Administered 2014-12-13: 15 mL

## 2014-12-13 MED ORDER — PHENYLEPHRINE HCL 2.5 % OP SOLN
1.0000 [drp] | OPHTHALMIC | Status: AC
  Administered 2014-12-13 (×3): 1 [drp] via OPHTHALMIC

## 2014-12-13 MED ORDER — BSS IO SOLN
INTRAOCULAR | Status: DC | PRN
  Administered 2014-12-13: 500 mL

## 2014-12-13 MED ORDER — EPINEPHRINE HCL 1 MG/ML IJ SOLN
INTRAMUSCULAR | Status: AC
  Filled 2014-12-13: qty 1

## 2014-12-13 MED ORDER — FENTANYL CITRATE (PF) 100 MCG/2ML IJ SOLN
INTRAMUSCULAR | Status: AC
  Filled 2014-12-13: qty 2

## 2014-12-13 MED ORDER — PROVISC 10 MG/ML IO SOLN
INTRAOCULAR | Status: DC | PRN
  Administered 2014-12-13: 0.85 mL via INTRAOCULAR

## 2014-12-13 MED ORDER — FENTANYL CITRATE (PF) 100 MCG/2ML IJ SOLN
25.0000 ug | Freq: Once | INTRAMUSCULAR | Status: AC
  Administered 2014-12-13: 25 ug via INTRAVENOUS

## 2014-12-13 MED ORDER — FENTANYL CITRATE (PF) 100 MCG/2ML IJ SOLN: 25.0000 ug | INTRAMUSCULAR | Status: DC | PRN

## 2014-12-13 MED ORDER — MIDAZOLAM HCL 2 MG/2ML IJ SOLN
1.0000 mg | INTRAMUSCULAR | Status: DC | PRN
Start: 2014-12-13 — End: 2014-12-13
  Administered 2014-12-13: 2 mg via INTRAVENOUS

## 2014-12-13 MED ORDER — MIDAZOLAM HCL 2 MG/2ML IJ SOLN
INTRAMUSCULAR | Status: AC
  Filled 2014-12-13: qty 2

## 2014-12-13 SURGICAL SUPPLY — 9 items
CLOTH BEACON ORANGE TIMEOUT ST (SAFETY) ×2 IMPLANT
EYE SHIELD UNIVERSAL CLEAR (GAUZE/BANDAGES/DRESSINGS) ×1 IMPLANT
GLOVE BIO SURGEON STRL SZ 6.5 (GLOVE) ×1 IMPLANT
GLOVE EXAM NITRILE MD LF STRL (GLOVE) ×1 IMPLANT
LENS ALC ACRYL/TECN (Ophthalmic Related) ×2 IMPLANT
PAD ARMBOARD 7.5X6 YLW CONV (MISCELLANEOUS) ×2 IMPLANT
TAPE SURG TRANSPORE 1 IN (GAUZE/BANDAGES/DRESSINGS) IMPLANT
TAPE SURGICAL TRANSPORE 1 IN (GAUZE/BANDAGES/DRESSINGS) ×1
WATER STERILE IRR 250ML POUR (IV SOLUTION) ×2 IMPLANT

## 2014-12-13 NOTE — Transfer of Care (Signed)
Immediate Anesthesia Transfer of Care Note  Patient: [name]  Procedure(s) Performed: Procedure(s) with comments: CATARACT EXTRACTION PHACO AND INTRAOCULAR LENS PLACEMENT (IOC) (Left) - CDE:7.17  Patient Location: PACU and Short Stay  Anesthesia Type:MAC  Level of Consciousness: awake, alert , oriented, patient cooperative and responds to stimulation  Airway & Oxygen Therapy: Patient Spontanous Breathing  Post-op Assessment: Report given to RN, Post -op Vital signs reviewed and stable and Patient moving all extremities X 4  Post vital signs: Reviewed and stable  Last Vitals:  Filed Vitals:   12/13/14 0936  BP: 120/61  Pulse: 70  Temp: 36.7 C  Resp: 22    Complications: No apparent anesthesia complications

## 2014-12-13 NOTE — Anesthesia Preprocedure Evaluation (Signed)
Anesthesia Evaluation  Patient identified by MRN, date of birth, ID band Patient awake    Reviewed: Allergy & Precautions, NPO status , Patient's Chart, lab work & pertinent test results  Airway Mallampati: II  TM Distance: >3 FB     Dental  (+) Partial Lower, Partial Upper   Pulmonary neg pulmonary ROS,  breath sounds clear to auscultation        Cardiovascular negative cardio ROS  Rhythm:Regular Rate:Normal     Neuro/Psych CVA, Residual Symptoms    GI/Hepatic negative GI ROS,   Endo/Other    Renal/GU      Musculoskeletal   Abdominal   Peds  Hematology   Anesthesia Other Findings   Reproductive/Obstetrics                             Anesthesia Physical Anesthesia Plan  ASA: III  Anesthesia Plan: MAC   Post-op Pain Management:    Induction: Intravenous  Airway Management Planned: Nasal Cannula  Additional Equipment:   Intra-op Plan:   Post-operative Plan:   Informed Consent: I have reviewed the patients History and Physical, chart, labs and discussed the procedure including the risks, benefits and alternatives for the proposed anesthesia with the patient or authorized representative who has indicated his/her understanding and acceptance.     Plan Discussed with:   Anesthesia Plan Comments:         Anesthesia Quick Evaluation  

## 2014-12-13 NOTE — Op Note (Signed)
Patient brought to the operating room and prepped and draped in the usual manner. Lid speculum inserted in left eye. Stab incision made at the twelve o'clock position. Provisc instilled in the anterior chamber. A 2.4 mm. Stab incision was made temporally. An anterior capsulotomy was done with a bent 25 gauge needle. The nucleus was hydrodissected. The Phaco tip was inserted in the anterior chamber and the nucleus was emulsified. CDE was 7.17. The cortical material was then removed with the I and A tip. Posterior capsule was the polished. The anterior chamber was deepened with Provisc. A 21.0 Diopter Alcon [id number] IOL was then inserted in the capsular bag. Provisc was then removed with the I and A tip. The wound was then hydrated. Patient sent to the Recovery Room in good condition with follow up in my office.  Preoperative Diagnosis: Cortical and Nuclear Cataract OS  Postoperative Diagnosis: Same  Procedure name: Kelman Phacoemulsification OS with IOL

## 2014-12-13 NOTE — Anesthesia Postprocedure Evaluation (Signed)
  Anesthesia Post-op Note  Patient: [name]  Procedure(s) Performed: Procedure(s) with comments: CATARACT EXTRACTION PHACO AND INTRAOCULAR LENS PLACEMENT (IOC) (Left) - CDE:7.17  Patient Location: PACU and Short Stay  Anesthesia Type:MAC  Level of Consciousness: awake, alert , oriented and responds to stimulation  Airway and Oxygen Therapy: Patient Spontanous Breathing  Post-op Pain: none  Post-op Assessment: Post-op Vital signs reviewed, Patient's Cardiovascular Status Stable, Respiratory Function Stable, Patent Airway, No signs of Nausea or vomiting and Pain level controlled              Post-op Vital Signs: Reviewed and stable  Last Vitals:  Filed Vitals:   12/13/14 0936  BP: 120/61  Pulse: 70  Temp: 36.7 C  Resp: 22    Complications: No apparent anesthesia complications

## 2014-12-13 NOTE — H&P (Signed)
The patient was re examined and there is no change in the patients condition since the original H and P. 

## 2014-12-13 NOTE — Discharge Instructions (Signed)
[name]  12/13/2014           [facility] [place] [address]      1. Avoid closing eyes tightly. One often closes the eye tightly when laughing, talking, sneezing, coughing or if they feel irritated. At these times, you should be careful not to close your eyes tightly.  2. Instill eye drops as instructed. To instill drops in your eye, open it, look up and have someone gently pull the lower lid down and instill a couple of drops inside the lower lid.  3. Do not touch upper lid.  4. Take Advil or Tylenol for pain.  5. You may use either eye for near work, such as reading or sewing and you may watch television.  6. You may have your hair done at the beauty parlor at any time.  7. Wear dark glasses with or without your own glasses if you are in bright light.  8. Call our office at [phone] or [phone] if you have sharp pain in your eye or unusual symptoms.  9. Do not be concerned because vision in the operative eye is not good. It will not be good, no matter how successful the operation, until you get a special lens for it. Your old glasses will not be suited to the new eye that was operated on and you will not be ready for a new lens for about a month.  10. Follow up at the [facility] office. Between 2-3 PM    I have received a copy of the above instructions and will follow them.      PATIENT INSTRUCTIONS POST-ANESTHESIA  IMMEDIATELY FOLLOWING SURGERY:  Do not drive or operate machinery for the first twenty four hours after surgery.  Do not make any important decisions for twenty four hours after surgery or while taking narcotic pain medications or sedatives.  If you develop intractable nausea and vomiting or a severe headache please notify your doctor immediately.  FOLLOW-UP:  Please make an appointment with your surgeon as instructed. You do not need to follow up with anesthesia unless specifically  instructed to do so.  WOUND CARE INSTRUCTIONS (if applicable):  Keep a dry clean dressing on the anesthesia/puncture wound site if there is drainage.  Once the wound has quit draining you may leave it open to air.  Generally you should leave the bandage intact for twenty four hours unless there is drainage.  If the epidural site drains for more than 36-48 hours please call the anesthesia department.  QUESTIONS?:  Please feel free to call your physician or the hospital operator if you have any questions, and they will be happy to assist you.

## 2014-12-14 ENCOUNTER — Encounter (HOSPITAL_COMMUNITY): Payer: Self-pay | Admitting: Ophthalmology

## 2014-12-14 NOTE — Addendum Note (Signed)
Addendum  created 12/14/14 1022 by Mickel Baas, CRNA   Modules edited: Charges VN

## 2015-04-18 DIAGNOSIS — Z961 Presence of intraocular lens: Secondary | ICD-10-CM | POA: Diagnosis not present

## 2015-06-05 DIAGNOSIS — G5691 Unspecified mononeuropathy of right upper limb: Secondary | ICD-10-CM | POA: Diagnosis not present

## 2015-09-21 DIAGNOSIS — E039 Hypothyroidism, unspecified: Secondary | ICD-10-CM | POA: Diagnosis not present

## 2015-09-21 DIAGNOSIS — Z1389 Encounter for screening for other disorder: Secondary | ICD-10-CM | POA: Diagnosis not present

## 2015-09-21 DIAGNOSIS — Z0001 Encounter for general adult medical examination with abnormal findings: Secondary | ICD-10-CM | POA: Diagnosis not present

## 2015-09-21 DIAGNOSIS — Z681 Body mass index (BMI) 19 or less, adult: Secondary | ICD-10-CM | POA: Diagnosis not present

## 2015-09-21 DIAGNOSIS — I639 Cerebral infarction, unspecified: Secondary | ICD-10-CM | POA: Diagnosis not present

## 2015-09-21 DIAGNOSIS — E782 Mixed hyperlipidemia: Secondary | ICD-10-CM | POA: Diagnosis not present

## 2015-09-22 DIAGNOSIS — D509 Iron deficiency anemia, unspecified: Secondary | ICD-10-CM | POA: Diagnosis not present

## 2015-09-22 DIAGNOSIS — D649 Anemia, unspecified: Secondary | ICD-10-CM | POA: Diagnosis not present

## 2015-09-22 DIAGNOSIS — Z79899 Other long term (current) drug therapy: Secondary | ICD-10-CM | POA: Diagnosis not present

## 2015-10-09 DIAGNOSIS — Z1389 Encounter for screening for other disorder: Secondary | ICD-10-CM | POA: Diagnosis not present

## 2015-10-09 DIAGNOSIS — D509 Iron deficiency anemia, unspecified: Secondary | ICD-10-CM | POA: Diagnosis not present

## 2015-10-09 DIAGNOSIS — Z681 Body mass index (BMI) 19 or less, adult: Secondary | ICD-10-CM | POA: Diagnosis not present

## 2015-10-24 DIAGNOSIS — Z1211 Encounter for screening for malignant neoplasm of colon: Secondary | ICD-10-CM | POA: Diagnosis not present

## 2015-12-13 DIAGNOSIS — Z0120 Encounter for dental examination and cleaning without abnormal findings: Secondary | ICD-10-CM | POA: Diagnosis not present

## 2015-12-29 DIAGNOSIS — R69 Illness, unspecified: Secondary | ICD-10-CM | POA: Diagnosis not present

## 2016-02-14 DIAGNOSIS — R946 Abnormal results of thyroid function studies: Secondary | ICD-10-CM | POA: Diagnosis not present

## 2016-02-14 DIAGNOSIS — Z681 Body mass index (BMI) 19 or less, adult: Secondary | ICD-10-CM | POA: Diagnosis not present

## 2016-02-14 DIAGNOSIS — D509 Iron deficiency anemia, unspecified: Secondary | ICD-10-CM | POA: Diagnosis not present

## 2016-02-16 ENCOUNTER — Encounter (INDEPENDENT_AMBULATORY_CARE_PROVIDER_SITE_OTHER): Payer: Self-pay

## 2016-02-16 ENCOUNTER — Encounter (INDEPENDENT_AMBULATORY_CARE_PROVIDER_SITE_OTHER): Payer: Self-pay | Admitting: Internal Medicine

## 2016-02-29 ENCOUNTER — Encounter (INDEPENDENT_AMBULATORY_CARE_PROVIDER_SITE_OTHER): Payer: Self-pay | Admitting: Internal Medicine

## 2016-02-29 ENCOUNTER — Ambulatory Visit (INDEPENDENT_AMBULATORY_CARE_PROVIDER_SITE_OTHER): Payer: Medicare HMO | Admitting: Internal Medicine

## 2016-02-29 ENCOUNTER — Other Ambulatory Visit (INDEPENDENT_AMBULATORY_CARE_PROVIDER_SITE_OTHER): Payer: Self-pay | Admitting: Internal Medicine

## 2016-02-29 VITALS — BP 120/68 | HR 80 | Temp 98.0°F | Ht 64.0 in | Wt 105.7 lb

## 2016-02-29 DIAGNOSIS — D508 Other iron deficiency anemias: Secondary | ICD-10-CM | POA: Diagnosis not present

## 2016-02-29 DIAGNOSIS — K921 Melena: Secondary | ICD-10-CM

## 2016-02-29 DIAGNOSIS — E7800 Pure hypercholesterolemia, unspecified: Secondary | ICD-10-CM

## 2016-02-29 DIAGNOSIS — G459 Transient cerebral ischemic attack, unspecified: Secondary | ICD-10-CM | POA: Insufficient documentation

## 2016-02-29 HISTORY — DX: Transient cerebral ischemic attack, unspecified: G45.9

## 2016-02-29 HISTORY — DX: Pure hypercholesterolemia, unspecified: E78.00

## 2016-02-29 NOTE — Progress Notes (Signed)
   Subjective:    Patient ID: [name], female    DOB: Feb 24, 1933, 82 y.o.   MRN: [id number]  HPI Referred by Dr. Hilma Favors for anemia.  She says she has a hx of anemia as a child. Has been taking Iron for about a month.  Three years ago her hemoglobin was normal.  02/14/2016 Hemoglobin normal.  She denies any rectal bleeding. There has been no weight loss. She has a BM daily usually. BM have been black for about 6 months). Last black stool was about a month a go.  She denies any acid reflux.  Her last colonoscopy was greater than 10 yrs ago.  02/14/2016 H and H 13.7 and 39.9 Ferritin 43, Folate 13.1, Iron 68, TIBC 160, vitamin B12 greater than 2000, UIBC 92.  10/09/2015 H and H  9.7 and 29.5,  09/22/2015 Ferritin 9, Iron 29  Hx of TIA and maintained on Aggrenox.  Review of Systems Past Medical History:  Diagnosis Date  . High cholesterol 02/29/2016  . Stroke ([place])   . TIA (transient ischemic attack) 02/29/2016    Past Surgical History:  Procedure Laterality Date  . APPENDECTOMY    . CATARACT EXTRACTION W/PHACO Right 11/22/2014   Procedure: CATARACT EXTRACTION PHACO AND INTRAOCULAR LENS PLACEMENT ([place]);  Surgeon: Rutherford Guys, MD;  Location: [facility];  Service: Ophthalmology;  Laterality: Right;  CDE:5.05  . CATARACT EXTRACTION W/PHACO Left 12/13/2014   Procedure: CATARACT EXTRACTION PHACO AND INTRAOCULAR LENS PLACEMENT (IOC);  Surgeon: Rutherford Guys, MD;  Location: [facility];  Service: Ophthalmology;  Laterality: Left;  CDE:7.17    No Known Allergies  Current Outpatient Prescriptions on File Prior to Visit  Medication Sig Dispense Refill  . dipyridamole-aspirin (AGGRENOX) 200-25 MG per 12 hr capsule Take 1 capsule by mouth 2 (two) times daily. 180 capsule 1  . simvastatin (ZOCOR) 20 MG tablet Take 20 mg by mouth every morning.     No current facility-administered medications on file prior to visit.        Objective:   Physical Exam Blood pressure 120/68, pulse 80, temperature  98 F (36.7 C), height 5\' 4"  (1.626 m), weight 105 lb 11.2 oz (47.9 kg). Alert and oriented. Skin warm and dry. Oral mucosa is moist.   . Sclera anicteric, conjunctivae is pink. Thyroid not enlarged. No cervical lymphadenopathy. Lungs clear. Heart regular rate and rhythm.  Abdomen is soft. Bowel sounds are positive. No hepatomegaly. No abdominal masses felt. No tenderness.  No edema to lower extremities. Stool brown and guaiac negative.        Assessment & Plan:  Anemia. Melena. Colonic neoplasm, ulcer needs to be ruled EGD/Colonoscopy. The risks and benefits such as perforation, bleeding, and infection were reviewed with the patient and is agreeable.

## 2016-02-29 NOTE — Patient Instructions (Signed)
EGD/Colonoscopy. The risks and benefits such as perforation, bleeding, and infection were reviewed with the patient and is agreeable. 

## 2016-03-05 ENCOUNTER — Encounter (INDEPENDENT_AMBULATORY_CARE_PROVIDER_SITE_OTHER): Payer: Self-pay | Admitting: *Deleted

## 2016-03-05 ENCOUNTER — Telehealth (INDEPENDENT_AMBULATORY_CARE_PROVIDER_SITE_OTHER): Payer: Self-pay | Admitting: *Deleted

## 2016-03-05 DIAGNOSIS — D649 Anemia, unspecified: Secondary | ICD-10-CM | POA: Insufficient documentation

## 2016-03-05 DIAGNOSIS — K921 Melena: Secondary | ICD-10-CM | POA: Insufficient documentation

## 2016-03-05 MED ORDER — PEG 3350-KCL-NA BICARB-NACL 420 G PO SOLR: 4000.0000 mL | Freq: Once | ORAL | 0 refills | Status: DC

## 2016-03-05 NOTE — Telephone Encounter (Signed)
Patient needs trilyte 

## 2016-03-05 NOTE — Telephone Encounter (Deleted)
Patient needs trilyte 

## 2016-03-05 NOTE — Telephone Encounter (Signed)
Error

## 2016-03-11 ENCOUNTER — Ambulatory Visit (HOSPITAL_COMMUNITY)
Admission: RE | Admit: 2016-03-11 | Discharge: 2016-03-11 | Disposition: A | Discharge status: Home / Self Care | Payer: Medicare HMO | Source: Ambulatory Visit | Attending: Internal Medicine | Admitting: Internal Medicine

## 2016-03-11 ENCOUNTER — Encounter (HOSPITAL_COMMUNITY): Payer: Self-pay | Admitting: *Deleted

## 2016-03-11 ENCOUNTER — Encounter (HOSPITAL_COMMUNITY)
Admission: RE | Disposition: A | Discharge status: Home / Self Care | Payer: Self-pay | Source: Ambulatory Visit | Attending: Internal Medicine

## 2016-03-11 DIAGNOSIS — B9681 Helicobacter pylori [H. pylori] as the cause of diseases classified elsewhere: Secondary | ICD-10-CM | POA: Diagnosis not present

## 2016-03-11 DIAGNOSIS — Z8719 Personal history of other diseases of the digestive system: Secondary | ICD-10-CM | POA: Insufficient documentation

## 2016-03-11 DIAGNOSIS — E7800 Pure hypercholesterolemia, unspecified: Secondary | ICD-10-CM | POA: Insufficient documentation

## 2016-03-11 DIAGNOSIS — D500 Iron deficiency anemia secondary to blood loss (chronic): Secondary | ICD-10-CM | POA: Insufficient documentation

## 2016-03-11 DIAGNOSIS — D120 Benign neoplasm of cecum: Secondary | ICD-10-CM | POA: Insufficient documentation

## 2016-03-11 DIAGNOSIS — K228 Other specified diseases of esophagus: Secondary | ICD-10-CM | POA: Diagnosis not present

## 2016-03-11 DIAGNOSIS — K921 Melena: Secondary | ICD-10-CM | POA: Diagnosis not present

## 2016-03-11 DIAGNOSIS — Z8711 Personal history of peptic ulcer disease: Secondary | ICD-10-CM | POA: Insufficient documentation

## 2016-03-11 DIAGNOSIS — Z8673 Personal history of transient ischemic attack (TIA), and cerebral infarction without residual deficits: Secondary | ICD-10-CM | POA: Insufficient documentation

## 2016-03-11 DIAGNOSIS — K3189 Other diseases of stomach and duodenum: Secondary | ICD-10-CM | POA: Insufficient documentation

## 2016-03-11 DIAGNOSIS — K259 Gastric ulcer, unspecified as acute or chronic, without hemorrhage or perforation: Secondary | ICD-10-CM | POA: Diagnosis not present

## 2016-03-11 DIAGNOSIS — K257 Chronic gastric ulcer without hemorrhage or perforation: Secondary | ICD-10-CM

## 2016-03-11 DIAGNOSIS — D123 Benign neoplasm of transverse colon: Secondary | ICD-10-CM | POA: Diagnosis not present

## 2016-03-11 DIAGNOSIS — D508 Other iron deficiency anemias: Secondary | ICD-10-CM

## 2016-03-11 DIAGNOSIS — Z7982 Long term (current) use of aspirin: Secondary | ICD-10-CM | POA: Diagnosis not present

## 2016-03-11 DIAGNOSIS — K254 Chronic or unspecified gastric ulcer with hemorrhage: Secondary | ICD-10-CM | POA: Diagnosis not present

## 2016-03-11 DIAGNOSIS — D649 Anemia, unspecified: Secondary | ICD-10-CM | POA: Insufficient documentation

## 2016-03-11 HISTORY — DX: Unspecified osteoarthritis, unspecified site: M19.90

## 2016-03-11 HISTORY — PX: COLONOSCOPY: SHX5424

## 2016-03-11 HISTORY — PX: ESOPHAGOGASTRODUODENOSCOPY: SHX5428

## 2016-03-11 SURGERY — COLONOSCOPY
Anesthesia: Moderate Sedation

## 2016-03-11 MED ORDER — BUTAMBEN-TETRACAINE-BENZOCAINE 2-2-14 % EX AERO
INHALATION_SPRAY | CUTANEOUS | Status: DC | PRN
  Administered 2016-03-11: 2 via TOPICAL

## 2016-03-11 MED ORDER — MEPERIDINE HCL 50 MG/ML IJ SOLN
INTRAMUSCULAR | Status: DC | PRN
  Administered 2016-03-11: 25 mg via INTRAVENOUS
  Administered 2016-03-11: 50 mg via INTRAVENOUS

## 2016-03-11 MED ORDER — STERILE WATER FOR IRRIGATION IR SOLN
Status: DC | PRN
  Administered 2016-03-11: 2.5 mL

## 2016-03-11 MED ORDER — MEPERIDINE HCL 50 MG/ML IJ SOLN
INTRAMUSCULAR | Status: AC
  Filled 2016-03-11: qty 1

## 2016-03-11 MED ORDER — MIDAZOLAM HCL 5 MG/5ML IJ SOLN
INTRAMUSCULAR | Status: DC | PRN
  Administered 2016-03-11 (×3): 1 mg via INTRAVENOUS

## 2016-03-11 MED ORDER — MIDAZOLAM HCL 5 MG/5ML IJ SOLN
INTRAMUSCULAR | Status: AC
  Filled 2016-03-11: qty 10

## 2016-03-11 MED ORDER — SODIUM CHLORIDE 0.9 % IV SOLN
INTRAVENOUS | Status: DC
  Administered 2016-03-11: 07:00:00 via INTRAVENOUS

## 2016-03-11 MED ORDER — PANTOPRAZOLE SODIUM 40 MG PO TBEC: 40.0000 mg | DELAYED_RELEASE_TABLET | Freq: Every day | ORAL | 1 refills | Status: DC

## 2016-03-11 NOTE — Op Note (Signed)
[facility] Patient Name: [name] Procedure Date: 03/11/2016 7:42 AM MRN: [id number] Date of Birth: March 19, 1933 Attending MD: Hildred Laser , MD CSN: [id number] Age: 82 Admit Type: Ambulatory Procedure:                Upper GI endoscopy Indications:              Melena Providers:                Hildred Laser, MD, Lurline Del, RN, Purcell Nails. Beaver,                            Technician Referring MD:             Halford Chessman, MD Medicines:                Cetacaine spray, Meperidine 50 mg IV, Midazolam 2                            mg IV Complications:            No immediate complications. Estimated Blood Loss:     Estimated blood loss: none. Procedure:                Pre-Anesthesia Assessment:                           - Prior to the procedure, a History and Physical                            was performed, and patient medications and                            allergies were reviewed. The patient's tolerance of                            previous anesthesia was also reviewed. The risks                            and benefits of the procedure and the sedation                            options and risks were discussed with the patient.                            All questions were answered, and informed consent                            was obtained. Prior Anticoagulants: The patient                            last took aspirin 3 days and dipyridamole 3 days                            prior to the procedure. ASA Grade Assessment: II -  A patient with mild systemic disease. After                            reviewing the risks and benefits, the patient was                            deemed in satisfactory condition to undergo the                            procedure.                           After obtaining informed consent, the endoscope was                            passed under direct vision. Throughout the                             procedure, the patient's blood pressure, pulse, and                            oxygen saturations were monitored continuously. The                            [id number]) scope was introduced through the                            mouth, and advanced to the second part of duodenum.                            The upper GI endoscopy was accomplished without                            difficulty. The patient tolerated the procedure                            well. Scope In: 7:50:06 AM Scope Out: 7:53:58 AM Total Procedure Duration: 0 hours 3 minutes 52 seconds  Findings:      The examined esophagus was normal.      The Z-line was irregular and was found 39 cm from the incisors.      One non-bleeding superficial gastric ulcer was found in the prepyloric       region of the stomach. The lesion was 4 mm in largest dimension.      A healed ulcer was found in the prepyloric region of the stomach.      The exam of the stomach was otherwise normal.      The duodenal bulb and second portion of the duodenum were normal. Impression:               - Normal esophagus.                           - Z-line irregular, 39 cm from the incisors.                           -  Non-bleeding gastric ulcer.                           - Scar in the prepyloric region of the stomach.                           - Normal duodenal bulb and second portion of the                            duodenum.                           - No specimens collected. Moderate Sedation:      Moderate (conscious) sedation was administered by the endoscopy nurse       and supervised by the endoscopist. The following parameters were       monitored: oxygen saturation, heart rate, blood pressure, CO2       capnography and response to care. Total physician intraservice time was       8 minutes. Recommendation:           - Patient has a contact number available for                            emergencies. The signs and symptoms of potential                             delayed complications were discussed with the                            patient. Return to normal activities tomorrow.                            Written discharge instructions were provided to the                            patient.                           - Resume previous diet today.                           - Continue present medications.                           - Resume Aggrenox at prior dose tomorrow.                           - Perform an H. pylori serology today.                           - Pantoprazole 40 mg by mouth every morning Procedure Code(s):        --- Professional ---                           [phone], Esophagogastroduodenoscopy, flexible,  transoral; diagnostic, including collection of                            specimen(s) by brushing or washing, when performed                            (separate procedure) Diagnosis Code(s):        --- Professional ---                           K22.8, Other specified diseases of esophagus                           K25.9, Gastric ulcer, unspecified as acute or                            chronic, without hemorrhage or perforation                           K31.89, Other diseases of stomach and duodenum                           K92.1, Melena (includes Hematochezia) CPT copyright 2016 [employer] Association. All rights reserved. The codes documented in this report are preliminary and upon coder review may  be revised to meet current compliance requirements. Hildred Laser, MD Hildred Laser, MD 03/11/2016 8:43:03 AM This report has been signed electronically. Number of Addenda: 0

## 2016-03-11 NOTE — Op Note (Signed)
[facility] Patient Name: [name] Procedure Date: 03/11/2016 7:56 AM MRN: [id number] Date of Birth: 1932/12/28 Attending MD: Hildred Laser , MD CSN: [id number] Age: 82 Admit Type: Ambulatory Procedure:                Colonoscopy Indications:              Gastrointestinal bleeding, Iron deficiency anemia                            secondary to chronic blood loss Providers:                Hildred Laser, MD, Lurline Del, RN, [facility],                            Technician Referring MD:             Halford Chessman, MD Medicines:                Midazolam 1 mg IV Complications:            No immediate complications. Estimated Blood Loss:     Estimated blood loss was minimal. Procedure:                Pre-Anesthesia Assessment:                           - Prior to the procedure, a History and Physical                            was performed, and patient medications and                            allergies were reviewed. The patient's tolerance of                            previous anesthesia was also reviewed. The risks                            and benefits of the procedure and the sedation                            options and risks were discussed with the patient.                            All questions were answered, and informed consent                            was obtained. Prior Anticoagulants: The patient                            last took aspirin 3 days and dipyridamole 3 days                            prior to the procedure. ASA Grade Assessment: II -  A patient with mild systemic disease. After                            reviewing the risks and benefits, the patient was                            deemed in satisfactory condition to undergo the                            procedure.                           After obtaining informed consent, the colonoscope                            was passed under direct vision. Throughout the                            procedure, the patient's blood pressure, pulse, and                            oxygen saturations were monitored continuously. The                            [id number]) was introduced through the and                            advanced to the the cecum, identified by                            appendiceal orifice and ileocecal valve. The                            colonoscopy was performed without difficulty. The                            patient tolerated the procedure well. The quality                            of the bowel preparation was excellent. The                            ileocecal valve, appendiceal orifice, and rectum                            were photographed. Scope In: 7:58:24 AM Scope Out: 8:21:47 AM Scope Withdrawal Time: 0 hours 17 minutes 37 seconds  Total Procedure Duration: 0 hours 23 minutes 23 seconds  Findings:      The perianal and digital rectal examinations were normal.      Three sessile polyps were found in the cecum. The polyps were small in       size. These were biopsied with a cold forceps for histology. The       pathology specimen was placed into Bottle Number 1.      A 5 mm  polyp was found in the cecum. The polyp was sessile. The polyp       was removed with a cold snare. Resection and retrieval were complete.       The pathology specimen was placed into Bottle Number 1.      The exam was otherwise without abnormality on direct and retroflexion       views. Impression:               - Three small polyps in the cecum. Biopsied.                           - One 5 mm polyp in the cecum, removed with a cold                            snare. Resected and retrieved.                           - The examination was otherwise normal on direct                            and retroflexion views. Moderate Sedation:      Moderate (conscious) sedation was administered by the endoscopy nurse       and supervised by the  endoscopist. The following parameters were       monitored: oxygen saturation, heart rate, blood pressure, CO2       capnography and response to care. Total physician intraservice time was       27 minutes. Recommendation:           - Patient has a contact number available for                            emergencies. The signs and symptoms of potential                            delayed complications were discussed with the                            patient. Return to normal activities tomorrow.                            Written discharge instructions were provided to the                            patient.                           - Resume previous diet today.                           - Continue present medications.                           - Resume Aggrenox at prior dose tomorrow.                           - Await pathology results.                           -  No recommendation at this time regarding repeat                            colonoscopy due to age. Procedure Code(s):        --- Professional ---                           [phone], Moderate sedation services provided by the                            same physician or other qualified health care                            professional performing the diagnostic or                            therapeutic service that the sedation supports,                            requiring the presence of an independent trained                            observer to assist in the monitoring of the                            patient's level of consciousness and physiological                            status; initial 15 minutes of intraservice time,                            patient age 58 years or older                           [phone], Moderate sedation services; each additional                            15 minutes intraservice time Diagnosis Code(s):        --- Professional ---                           D12.0, Benign neoplasm of cecum                            K92.2, Gastrointestinal hemorrhage, unspecified                           D50.0, Iron deficiency anemia secondary to blood                            loss (chronic) CPT copyright 2016 American Medical Association. All rights reserved. The codes documented in this report are preliminary and upon coder review may  be revised to meet current compliance requirements. Hildred Laser, MD Hildred Laser, MD 03/11/2016 8:48:19 AM This report has been signed electronically. Number of Addenda: 0

## 2016-03-11 NOTE — Discharge Instructions (Signed)
Resume Aggrenox on 03/12/2016. Resume other medications as before. Resume usual diet. No driving for 24 hours. Physician will call with result of biopsy and blood test.  Colonoscopy, Adult, Care After This sheet gives you information about how to care for yourself after your procedure. Your doctor may also give you more specific instructions. If you have problems or questions, call your doctor. Follow these instructions at home: General instructions  For the first 24 hours after the procedure:  Do not drive or use machinery.  Do not sign important documents.  Do not drink alcohol.  Do your daily activities more slowly than normal.  Eat foods that are soft and easy to digest.  Rest often.  Take over-the-counter or prescription medicines only as told by your doctor.  It is up to you to get the results of your procedure. Ask your doctor, or the department performing the procedure, when your results will be ready. To help cramping and bloating:  Try walking around.  Put heat on your belly (abdomen) as told by your doctor. Use a heat source that your doctor recommends, such as a moist heat pack or a heating pad.  Put a towel between your skin and the heat source.  Leave the heat on for 20-30 minutes.  Remove the heat if your skin turns bright red. This is especially important if you cannot feel pain, heat, or cold. You can get burned. Eating and drinking  Drink enough fluid to keep your pee (urine) clear or pale yellow.  Return to your normal diet as told by your doctor. Avoid heavy or fried foods that are hard to digest.  Avoid drinking alcohol for as long as told by your doctor. Contact a doctor if:  You have blood in your poop (stool) 2-3 days after the procedure. Get help right away if:  You have more than a small amount of blood in your poop.  You see large clumps of tissue (blood clots) in your poop.  Your belly is swollen.  You feel sick to your stomach  (nauseous).  You throw up (vomit).  You have a fever.  You have belly pain that gets worse, and medicine does not help your pain. This information is not intended to replace advice given to you by your health care provider. Make sure you discuss any questions you have with your health care provider. Document Released: 04/20/2010 Document Revised: 12/11/2015 Document Reviewed: 12/11/2015 Elsevier Interactive Patient Education  2017 [facility], Care After Refer to this sheet in the next few weeks. These instructions provide you with information about caring for yourself after your procedure. Your health care provider may also give you more specific instructions. Your treatment has been planned according to current medical practices, but problems sometimes occur. Call your health care provider if you have any problems or questions after your procedure. What can I expect after the procedure? After the procedure, it is common to have:  A sore throat.  Bloating.  Nausea. Follow these instructions at home:  Follow instructions from your health care provider about what to eat or drink after your procedure.  Return to your normal activities as told by your health care provider. Ask your health care provider what activities are safe for you.  Take over-the-counter and prescription medicines only as told by your health care provider.  Do not drive for 24 hours if you received a sedative.  Keep all follow-up visits as told by your health care  provider. This is important. Contact a health care provider if:  You have a sore throat that lasts longer than one day.  You have trouble swallowing. Get help right away if:  You have a fever.  You vomit blood or your vomit looks like coffee grounds.  You have bloody, black, or tarry stools.  You have a severe sore throat or you cannot swallow.  You have difficulty breathing.  You have severe pain in your chest or  belly. This information is not intended to replace advice given to you by your health care provider. Make sure you discuss any questions you have with your health care provider. Document Released: 09/17/2011 Document Revised: 08/24/2015 Document Reviewed: 12/29/2014 Elsevier Interactive Patient Education  2017 [employer].

## 2016-03-11 NOTE — H&P (Signed)
[name] is an 82 y.o. female.   Chief Complaint: Patient is here for EGD and colonoscopy. HPI: This 82 year old Caucasian female who presents with history of melena anemia. H&H  has corrected with by mouth iron. She denies heartburn nausea vomiting dysphagia or abdominal pain or rectal bleeding. Appetite is fair. She has not lost any weight recently. Her last colonoscopy was several years ago. She is on Aggrenox which is on hold. Family history is negative for CRC.  Past Medical History:  Diagnosis Date  . Arthritis   . High cholesterol 02/29/2016  . Stroke ([place])   . TIA (transient ischemic attack) 02/29/2016    Past Surgical History:  Procedure Laterality Date  . APPENDECTOMY    . CATARACT EXTRACTION W/PHACO Right 11/22/2014   Procedure: CATARACT EXTRACTION PHACO AND INTRAOCULAR LENS PLACEMENT ([place]);  Surgeon: Rutherford Guys, MD;  Location: [facility];  Service: Ophthalmology;  Laterality: Right;  CDE:5.05  . CATARACT EXTRACTION W/PHACO Left 12/13/2014   Procedure: CATARACT EXTRACTION PHACO AND INTRAOCULAR LENS PLACEMENT ([facility]);  Surgeon: Rutherford Guys, MD;  Location: [facility];  Service: Ophthalmology;  Laterality: Left;  CDE:7.17    History reviewed. No pertinent family history. Social History:  reports that she has never smoked. She has never used smokeless tobacco. She reports that she does not drink alcohol or use drugs.  Allergies: No Known Allergies  Medications Prior to Admission  Medication Sig Dispense Refill  . dipyridamole-aspirin (AGGRENOX) 200-25 MG per 12 hr capsule Take 1 capsule by mouth 2 (two) times daily. 180 capsule 1  . ferrous sulfate 325 (65 FE) MG tablet Take 325 mg by mouth daily with breakfast.    . Multiple Vitamin (MULTIVITAMIN WITH MINERALS) TABS tablet Take 1 tablet by mouth daily.    . simvastatin (ZOCOR) 20 MG tablet Take 20 mg by mouth every morning.      No results found for this or any previous visit (from the past 48 hour(s)). No results  found.  ROS  Blood pressure 131/65, pulse 78, temperature 97.6 F (36.4 C), temperature source Oral, resp. rate 12, SpO2 100 %. Physical Exam  Constitutional:  Well-developed thin Caucasian female in NAD.  HENT:  Mouth/Throat: Oropharynx is clear and moist.  Eyes: Conjunctivae are normal. No scleral icterus.  Neck: No thyromegaly present.  Cardiovascular: Normal rate, regular rhythm and normal heart sounds.   No murmur heard. Respiratory: Effort normal and breath sounds normal.  GI: She exhibits no distension and no mass. There is no tenderness.  Musculoskeletal: She exhibits no edema.  Lymphadenopathy:    She has no cervical adenopathy.  Neurological: She is alert.  Skin: Skin is warm and dry.     Assessment/Plan History of melena and anemia. Diagnostic EGD and colonoscopy.  Hildred Laser, MD 03/11/2016, 7:39 AM

## 2016-03-12 LAB — H. PYLORI ANTIBODY, IGG: H Pylori IgG: >8 U/mL — ABNORMAL HIGH (ref 0.0–0.8)

## 2016-03-14 ENCOUNTER — Encounter (HOSPITAL_COMMUNITY): Payer: Self-pay | Admitting: Internal Medicine

## 2016-03-15 ENCOUNTER — Encounter (INDEPENDENT_AMBULATORY_CARE_PROVIDER_SITE_OTHER): Payer: Self-pay

## 2016-03-15 ENCOUNTER — Other Ambulatory Visit (INDEPENDENT_AMBULATORY_CARE_PROVIDER_SITE_OTHER): Payer: Self-pay | Admitting: Internal Medicine

## 2016-03-15 MED ORDER — BIS SUBCIT-METRONID-TETRACYC 140-125-125 MG PO CAPS: 3.0000 | ORAL_CAPSULE | Freq: Three times a day (TID) | ORAL | 0 refills | Status: DC

## 2016-03-18 ENCOUNTER — Encounter (INDEPENDENT_AMBULATORY_CARE_PROVIDER_SITE_OTHER): Payer: Self-pay

## 2016-05-15 ENCOUNTER — Telehealth (INDEPENDENT_AMBULATORY_CARE_PROVIDER_SITE_OTHER): Payer: Self-pay | Admitting: *Deleted

## 2016-05-15 NOTE — Telephone Encounter (Signed)
Patient tested positive for H-pylori in December 20107. Pylera was not covered and the cost for the patient was over $900.  We found this out later and Dr.Rehman ask that we call in Prilosec 20 mg by mouth twice a day for 10 days , Clarithromycin 500 mg by mouth twice a day 10 days , Amoxicillin 1 Gram by mouth twice a day for 10 days.  This was called to [facility] in [place]. Patient was called and told , she states that she is feeling much better and that she is not going to take this medication , that she is going to trust the [facility].  Patient did ask questions about this bacteria and they were answered, explaining that if left untreated it may cause ulcer (s) , inflammation of the lining of the stomach,(gastrtis) , and even cancer. This will be addressed with Dr.Rehman.

## 2016-06-25 DIAGNOSIS — Z0120 Encounter for dental examination and cleaning without abnormal findings: Secondary | ICD-10-CM | POA: Diagnosis not present

## 2016-08-08 ENCOUNTER — Emergency Department: Payer: Medicare HMO

## 2016-08-08 ENCOUNTER — Encounter: Payer: Self-pay | Admitting: Emergency Medicine

## 2016-08-08 ENCOUNTER — Inpatient Hospital Stay
Admission: EM | Admit: 2016-08-08 | Discharge: 2016-08-11 | DRG: 469 | Disposition: A | Discharge status: Home Health | Payer: Medicare HMO | Attending: Internal Medicine | Admitting: Internal Medicine

## 2016-08-08 DIAGNOSIS — Z8673 Personal history of transient ischemic attack (TIA), and cerebral infarction without residual deficits: Secondary | ICD-10-CM | POA: Diagnosis not present

## 2016-08-08 DIAGNOSIS — Z681 Body mass index (BMI) 19 or less, adult: Secondary | ICD-10-CM | POA: Diagnosis not present

## 2016-08-08 DIAGNOSIS — M79605 Pain in left leg: Secondary | ICD-10-CM | POA: Diagnosis not present

## 2016-08-08 DIAGNOSIS — Z9841 Cataract extraction status, right eye: Secondary | ICD-10-CM | POA: Diagnosis not present

## 2016-08-08 DIAGNOSIS — E43 Unspecified severe protein-calorie malnutrition: Secondary | ICD-10-CM | POA: Diagnosis not present

## 2016-08-08 DIAGNOSIS — S72042A Displaced fracture of base of neck of left femur, initial encounter for closed fracture: Secondary | ICD-10-CM | POA: Diagnosis not present

## 2016-08-08 DIAGNOSIS — Z79899 Other long term (current) drug therapy: Secondary | ICD-10-CM | POA: Diagnosis not present

## 2016-08-08 DIAGNOSIS — Z961 Presence of intraocular lens: Secondary | ICD-10-CM | POA: Diagnosis present

## 2016-08-08 DIAGNOSIS — Z96642 Presence of left artificial hip joint: Secondary | ICD-10-CM | POA: Diagnosis not present

## 2016-08-08 DIAGNOSIS — R9431 Abnormal electrocardiogram [ECG] [EKG]: Secondary | ICD-10-CM | POA: Diagnosis not present

## 2016-08-08 DIAGNOSIS — M25552 Pain in left hip: Secondary | ICD-10-CM | POA: Diagnosis not present

## 2016-08-08 DIAGNOSIS — Z9842 Cataract extraction status, left eye: Secondary | ICD-10-CM

## 2016-08-08 DIAGNOSIS — S72045A Nondisplaced fracture of base of neck of left femur, initial encounter for closed fracture: Secondary | ICD-10-CM | POA: Diagnosis not present

## 2016-08-08 DIAGNOSIS — E785 Hyperlipidemia, unspecified: Secondary | ICD-10-CM | POA: Diagnosis not present

## 2016-08-08 DIAGNOSIS — Z01818 Encounter for other preprocedural examination: Secondary | ICD-10-CM | POA: Diagnosis not present

## 2016-08-08 DIAGNOSIS — S72002A Fracture of unspecified part of neck of left femur, initial encounter for closed fracture: Secondary | ICD-10-CM | POA: Diagnosis not present

## 2016-08-08 DIAGNOSIS — D649 Anemia, unspecified: Secondary | ICD-10-CM | POA: Diagnosis not present

## 2016-08-08 DIAGNOSIS — S72009A Fracture of unspecified part of neck of unspecified femur, initial encounter for closed fracture: Secondary | ICD-10-CM | POA: Diagnosis not present

## 2016-08-08 DIAGNOSIS — S0990XA Unspecified injury of head, initial encounter: Secondary | ICD-10-CM | POA: Diagnosis not present

## 2016-08-08 DIAGNOSIS — Z96649 Presence of unspecified artificial hip joint: Secondary | ICD-10-CM

## 2016-08-08 DIAGNOSIS — E7800 Pure hypercholesterolemia, unspecified: Secondary | ICD-10-CM | POA: Diagnosis not present

## 2016-08-08 DIAGNOSIS — W010XXA Fall on same level from slipping, tripping and stumbling without subsequent striking against object, initial encounter: Secondary | ICD-10-CM | POA: Diagnosis present

## 2016-08-08 DIAGNOSIS — D62 Acute posthemorrhagic anemia: Secondary | ICD-10-CM | POA: Diagnosis not present

## 2016-08-08 DIAGNOSIS — T148XXA Other injury of unspecified body region, initial encounter: Secondary | ICD-10-CM | POA: Diagnosis not present

## 2016-08-08 LAB — BASIC METABOLIC PANEL
Anion gap: 9 (ref 5–15)
BUN: 12 mg/dL (ref 6–20)
CALCIUM: 10.1 mg/dL (ref 8.9–10.3)
CO2: 27 mmol/L (ref 22–32)
CREATININE: 1.04 mg/dL — AB (ref 0.44–1.00)
Chloride: 104 mmol/L (ref 101–111)
GFR calc Af Amer: 57 mL/min — ABNORMAL LOW (ref >60)
GFR calc non Af Amer: 49 mL/min — ABNORMAL LOW (ref >60)
GLUCOSE: 117 mg/dL — AB (ref 65–99)
Potassium: 3.6 mmol/L (ref 3.5–5.1)
Sodium: 140 mmol/L (ref 135–145)

## 2016-08-08 LAB — CBC
HEMATOCRIT: 35.4 % (ref 35.0–47.0)
HEMOGLOBIN: 12.3 g/dL (ref 12.0–16.0)
MCH: 33.9 pg (ref 26.0–34.0)
MCHC: 34.7 g/dL (ref 32.0–36.0)
MCV: 97.8 fL (ref 80.0–100.0)
Platelets: 268 10*3/uL (ref 150–440)
RBC: 3.61 MIL/uL — AB (ref 3.80–5.20)
RDW: 13.3 % (ref 11.5–14.5)
WBC: 11.3 10*3/uL — AB (ref 3.6–11.0)

## 2016-08-08 LAB — PROTIME-INR
INR: 1.04
Prothrombin Time: 13.6 seconds (ref 11.4–15.2)

## 2016-08-08 LAB — TYPE AND SCREEN
ABO/RH(D): A POS
Antibody Screen: NEGATIVE

## 2016-08-08 LAB — APTT: aPTT: 25 seconds (ref 24–36)

## 2016-08-08 MED ORDER — MORPHINE SULFATE (PF) 2 MG/ML IV SOLN
2.0000 mg | INTRAVENOUS | Status: DC | PRN
  Administered 2016-08-08: 2 mg via INTRAVENOUS
  Filled 2016-08-08: qty 1

## 2016-08-08 MED ORDER — FENTANYL CITRATE (PF) 100 MCG/2ML IJ SOLN
50.0000 ug | Freq: Once | INTRAMUSCULAR | Status: AC
  Administered 2016-08-08: 50 ug via INTRAVENOUS

## 2016-08-08 MED ORDER — MORPHINE SULFATE (PF) 2 MG/ML IV SOLN: 2.0000 mg | INTRAVENOUS | Status: DC | PRN

## 2016-08-08 MED ORDER — FENTANYL CITRATE (PF) 100 MCG/2ML IJ SOLN
INTRAMUSCULAR | Status: AC
  Administered 2016-08-08: 50 ug via INTRAVENOUS
  Filled 2016-08-08: qty 2

## 2016-08-08 MED ORDER — ACETAMINOPHEN 650 MG RE SUPP: 650.0000 mg | Freq: Four times a day (QID) | RECTAL | Status: DC | PRN

## 2016-08-08 MED ORDER — HYDROCODONE-ACETAMINOPHEN 5-325 MG PO TABS
1.0000 | ORAL_TABLET | ORAL | Status: DC | PRN
  Administered 2016-08-08 – 2016-08-09 (×2): 1 via ORAL
  Administered 2016-08-09: 2 via ORAL
  Administered 2016-08-09: 1 via ORAL
  Filled 2016-08-08 (×3): qty 1
  Filled 2016-08-08: qty 2
  Filled 2016-08-08: qty 1

## 2016-08-08 MED ORDER — ADULT MULTIVITAMIN W/MINERALS CH
1.0000 | ORAL_TABLET | Freq: Every day | ORAL | Status: DC
  Administered 2016-08-10 – 2016-08-11 (×2): 1 via ORAL
  Filled 2016-08-08 (×2): qty 1

## 2016-08-08 MED ORDER — SIMVASTATIN 20 MG PO TABS
20.0000 mg | ORAL_TABLET | Freq: Every morning | ORAL | Status: DC
  Administered 2016-08-10 – 2016-08-11 (×2): 20 mg via ORAL
  Filled 2016-08-08 (×2): qty 1

## 2016-08-08 MED ORDER — MORPHINE SULFATE (PF) 2 MG/ML IV SOLN
2.0000 mg | Freq: Once | INTRAVENOUS | Status: AC
  Administered 2016-08-08: 2 mg via INTRAVENOUS

## 2016-08-08 MED ORDER — ACETAMINOPHEN 325 MG PO TABS: 650.0000 mg | ORAL_TABLET | Freq: Four times a day (QID) | ORAL | Status: DC | PRN

## 2016-08-08 MED ORDER — BIS SUBCIT-METRONID-TETRACYC 140-125-125 MG PO CAPS: 3.0000 | ORAL_CAPSULE | Freq: Three times a day (TID) | ORAL | Status: DC

## 2016-08-08 MED ORDER — ONDANSETRON HCL 4 MG/2ML IJ SOLN
4.0000 mg | Freq: Four times a day (QID) | INTRAMUSCULAR | Status: DC | PRN
  Administered 2016-08-08: 4 mg via INTRAVENOUS
  Filled 2016-08-08: qty 2

## 2016-08-08 MED ORDER — CEFAZOLIN SODIUM-DEXTROSE 2-4 GM/100ML-% IV SOLN: 2.0000 g | INTRAVENOUS | Status: DC

## 2016-08-08 MED ORDER — ONDANSETRON HCL 4 MG PO TABS: 4.0000 mg | ORAL_TABLET | Freq: Four times a day (QID) | ORAL | Status: DC | PRN

## 2016-08-08 MED ORDER — SODIUM CHLORIDE 0.9 % IV SOLN
INTRAVENOUS | Status: DC
  Administered 2016-08-08 – 2016-08-09 (×2): via INTRAVENOUS

## 2016-08-08 MED ORDER — MORPHINE SULFATE (PF) 2 MG/ML IV SOLN
INTRAVENOUS | Status: AC
  Administered 2016-08-08: 2 mg via INTRAVENOUS
  Filled 2016-08-08: qty 1

## 2016-08-08 MED ORDER — CEFAZOLIN SODIUM 10 G IJ SOLR
2.0000 g | INTRAMUSCULAR | Status: AC
  Administered 2016-08-09: 2 g via INTRAVENOUS
  Filled 2016-08-08 (×2): qty 2000

## 2016-08-08 NOTE — Consult Note (Signed)
ORTHOPAEDIC CONSULTATION  PATIENT NAME: [name] DOB: April 09, 1932  MRN: [id number]  REQUESTING PHYSICIAN: Dustin Flock, MD  Chief Complaint: Left hip pain  HPI: [name] is a 83 y.o. female who complains of  left hip pain. The patient was in her yard and attempting to carry heavy item when she lost her balance and fell, landing on her left hip and side. She was unable stand or bear weight due to left hip pain. She denied any other injuries. She denied any loss of consciousness. Prior to the injury she was an independent ambulator.  Past Medical History:  Diagnosis Date  . Arthritis   . High cholesterol 02/29/2016  . Stroke ([place])   . TIA (transient ischemic attack) 02/29/2016   Past Surgical History:  Procedure Laterality Date  . APPENDECTOMY    . CATARACT EXTRACTION W/PHACO Right 11/22/2014   Procedure: CATARACT EXTRACTION PHACO AND INTRAOCULAR LENS PLACEMENT ([place]);  Surgeon: Rutherford Guys, MD;  Location: [facility];  Service: Ophthalmology;  Laterality: Right;  CDE:5.05  . CATARACT EXTRACTION W/PHACO Left 12/13/2014   Procedure: CATARACT EXTRACTION PHACO AND INTRAOCULAR LENS PLACEMENT ([facility]);  Surgeon: Rutherford Guys, MD;  Location: [facility];  Service: Ophthalmology;  Laterality: Left;  CDE:7.17  . COLONOSCOPY N/A 03/11/2016   Procedure: COLONOSCOPY;  Surgeon: Rogene Houston, MD;  Location: [facility];  Service: Endoscopy;  Laterality: N/A;  7:30  . ESOPHAGOGASTRODUODENOSCOPY N/A 03/11/2016   Procedure: ESOPHAGOGASTRODUODENOSCOPY (EGD);  Surgeon: Rogene Houston, MD;  [facility]: [facility];  Service: Endoscopy;  Laterality: N/A;   Social History   Social History  . Marital status: Married    Spouse name: N/A  . Number of children: N/A  . Years of education: N/A   Social History Main Topics  . Smoking status: Never Smoker  . Smokeless tobacco: Never Used  . Alcohol use No  . Drug use: No  . Sexual activity: Not Asked   Other Topics Concern  . None   Social  History Narrative  . None   History reviewed. No pertinent family history. No Known Allergies Prior to Admission medications   Medication Sig Start Date End Date Taking? Authorizing Provider  dipyridamole-aspirin (AGGRENOX) 200-25 MG 12hr capsule Take 1 capsule by mouth 2 (two) times daily. 03/12/16  Yes Rehman, Mechele Dawley, MD  Multiple Vitamin (MULTIVITAMIN WITH MINERALS) TABS tablet Take 1 tablet by mouth daily.   Yes [provider]  simvastatin (ZOCOR) 20 MG tablet Take 20 mg by mouth every morning.   Yes [provider]  bismuth-metronidazole-tetracycline (PYLERA) [phone] MG capsule Take 3 capsules by mouth 4 (four) times daily -  before meals and at bedtime. Patient not taking: Reported on 08/08/2016 03/15/16   Rogene Houston, MD   Ct Head Wo Contrast  Result Date: 08/08/2016 CLINICAL DATA:  83 year old female with a history of fall EXAM: CT HEAD WITHOUT CONTRAST TECHNIQUE: Contiguous axial images were obtained from the base of the skull through the vertex without intravenous contrast. COMPARISON:  MR head 10/07/2008 FINDINGS: Brain: No acute intracranial hemorrhage. No midline shift or mass effect. Diffuse brain volume loss. Periventricular white matter hypodensity. Unremarkable configuration the ventricles. Vascular: Calcifications of the intracranial vasculature. Skull: No aggressive bone lesion.  No fracture. Sinuses/Orbits: Unremarkable Other: None IMPRESSION: No CT evidence of acute intracranial abnormality. Evidence of chronic microvascular ischemic disease with associated intracranial atherosclerosis. Electronically Signed   By: Corrie Mckusick D.O.   On: 08/08/2016 15:08   Dg Chest Portable 1 View  Result Date: 08/08/2016 CLINICAL DATA:  Preoperative evaluation EXAM: PORTABLE CHEST 1 VIEW COMPARISON:  02/10/2014 FINDINGS: The heart size and mediastinal contours are within normal limits. Both lungs are clear. The visualized skeletal structures are unremarkable.  IMPRESSION: No active disease. Electronically Signed   By: Inez Catalina M.D.   On: 08/08/2016 15:01   Dg Hip Unilat W Or Wo Pelvis 2-3 Views Left  Result Date: 08/08/2016 CLINICAL DATA:  Pain after fall. EXAM: DG HIP (WITH OR WITHOUT PELVIS) 2-3V LEFT COMPARISON:  None. FINDINGS: There is a nondisplaced fracture through the left femoral neck without dislocation. IMPRESSION: Nondisplaced fracture through the left femoral neck. Electronically Signed   By: Dorise Bullion III M.D   On: 08/08/2016 15:16    Positive ROS: All other systems have been reviewed and were otherwise negative with the exception of those mentioned in the HPI and as above.  Physical Exam: General: Well developed, well nourished female seen in no acute distress. HEENT: Atraumatic and normocephalic. Sclera are clear. Extraocular motion is intact. Oropharynx is clear with moist mucosa. Neck: Supple, nontender, good range of motion. No JVD or carotid bruits. Lungs: Clear to auscultation bilaterally. Cardiovascular: Regular rate and rhythm with normal S1 and S2. No murmurs. No gallops or rubs. Pedal pulses are palpable bilaterally. Homans test is negative bilaterally. No significant pretibial or ankle edema. Abdomen: Soft, nontender, and nondistended. Bowel sounds are present. Skin: No lesions in the area of chief complaint Neurologic: Awake, alert, and oriented. Sensory function is grossly intact. Motor strength is felt to be 5 over 5 with the exception of the left lower extremity was not assessed due to the injury. No clonus or tremor. Good motor coordination. Lymphatic: No axillary or cervical lymphadenopathy  MUSCULOSKELETAL:  Examination of the left lower extremity demonstrates slight shortening and rotation. Pain is elicited with attempts at passive range of motion of the left hip. No significant swelling or ecchymosis to the lateral buttocks or thigh. No gross tenderness to palpation of the knee or ankle.  Radiographs: I  reviewed AP pelvis, AP and lateral radiographs of the left hip that were obtained earlier today. There is a fracture noted at the base of the femoral neck with some displacement.  Assessment: Displaced left femoral neck fracture  Plan: The findings were discussed in detail with the patient and her family. I have recommended left hip hemiarthroplasty for treatment of the displaced femoral neck fracture. The usual perioperative course was discussed. The risks and benefits of surgical intervention were reviewed. The patient expressed understanding of the risks and benefits and agreed with plans for surgical intervention.   The surgical site was signed as per the "right site surgery" protocol.   James P. Holley Bouche M.D.

## 2016-08-08 NOTE — ED Provider Notes (Signed)
[facility] Emergency Department Provider Note  ____________________________________________  Time seen: Approximately 2:13 PM  I have reviewed the triage vital signs and the nursing notes.   HISTORY  Chief Complaint Fall   HPI [name] is a 83 y.o. female with h/o TIA on aggrenox who presents for evaluation of left hip pain status post mechanical fall. she was in her yard with her husband helping him carry something heavy when she lost her balance and fell onto her left hip. She denies head trauma or LOC. She is complaining of severe constant sharp and nonradiating pain located in the lateral aspect of her left proximal femur that is worse with movement. Unable to bear weight after the fall. She denies headache, neck pain, back pain, chest pain, abdominal pain, other extremity pain. Patient denies dizziness or any preceding symptoms and endorses that the fall was simply mechanical in nature.  Past Medical History:  Diagnosis Date  . Arthritis   . High cholesterol 02/29/2016  . Stroke ([place])   . TIA (transient ischemic attack) 02/29/2016    Patient Active Problem List   Diagnosis Date Noted  . Melena 03/05/2016  . Absolute anemia 03/05/2016  . TIA (transient ischemic attack) 02/29/2016  . High cholesterol 02/29/2016    Past Surgical History:  Procedure Laterality Date  . APPENDECTOMY    . CATARACT EXTRACTION W/PHACO Right 11/22/2014   Procedure: CATARACT EXTRACTION PHACO AND INTRAOCULAR LENS PLACEMENT ([place]);  Surgeon: Rutherford Guys, MD;  Location: [facility] ORS;  Service: Ophthalmology;  Laterality: Right;  CDE:5.05  . CATARACT EXTRACTION W/PHACO Left 12/13/2014   Procedure: CATARACT EXTRACTION PHACO AND INTRAOCULAR LENS PLACEMENT (IOC);  Surgeon: Rutherford Guys, MD;  Location: [facility] ORS;  Service: Ophthalmology;  Laterality: Left;  CDE:7.17  . COLONOSCOPY N/A 03/11/2016   Procedure: COLONOSCOPY;  Surgeon: Rogene Houston, MD;  Location: [facility] ENDO SUITE;   Service: Endoscopy;  Laterality: N/A;  7:30  . ESOPHAGOGASTRODUODENOSCOPY N/A 03/11/2016   Procedure: ESOPHAGOGASTRODUODENOSCOPY (EGD);  Surgeon: Rogene Houston, MD;  Location: [facility] ENDO SUITE;  Service: Endoscopy;  Laterality: N/A;    Prior to Admission medications   Medication Sig Start Date End Date Taking? Authorizing Provider  dipyridamole-aspirin (AGGRENOX) 200-25 MG 12hr capsule Take 1 capsule by mouth 2 (two) times daily. 03/12/16  Yes Rehman, Mechele Dawley, MD  Multiple Vitamin (MULTIVITAMIN WITH MINERALS) TABS tablet Take 1 tablet by mouth daily.   Yes [provider]  simvastatin (ZOCOR) 20 MG tablet Take 20 mg by mouth every morning.   Yes [provider]  bismuth-metronidazole-tetracycline (PYLERA) [phone] MG capsule Take 3 capsules by mouth 4 (four) times daily -  before meals and at bedtime. Patient not taking: Reported on 08/08/2016 03/15/16   Rogene Houston, MD    Allergies Patient has no known allergies.  History reviewed. No pertinent family history.  Social History Social History  Substance Use Topics  . Smoking status: Never Smoker  . Smokeless tobacco: Never Used  . Alcohol use No    Review of Systems Constitutional: Negative for fever. Eyes: Negative for visual changes. ENT: Negative for facial injury or neck injury Cardiovascular: Negative for chest injury. Respiratory: Negative for shortness of breath. Negative for chest wall injury. Gastrointestinal: Negative for abdominal pain or injury. Genitourinary: Negative for dysuria. Musculoskeletal: Negative for back injury, negative for arm or leg pain. + L hip pain Skin: Negative for laceration/abrasions. Neurological: Negative for head injury.   ____________________________________________   PHYSICAL EXAM:  VITAL SIGNS: ED  Triage Vitals  Enc Vitals Group     BP 08/08/16 1339 100/87     Pulse Rate 08/08/16 1339 87     Resp 08/08/16 1339 16     Temp 08/08/16 1339 98.1 F (36.7 C)      Temp Source 08/08/16 1339 Oral     SpO2 08/08/16 1339 100 %     Weight 08/08/16 1339 100 lb (45.4 kg)     Height 08/08/16 1339 5\' 3"  (1.6 m)     Head Circumference --      Peak Flow --      Pain Score 08/08/16 1338 4     Pain Loc --      Pain Edu? --      Excl. in [place]? --    Full spinal precautions maintained throughout the trauma exam. Constitutional: Alert and oriented. No acute distress. Does not appear intoxicated. HEENT Head: Normocephalic and atraumatic. Face: No facial bony tenderness. Stable midface Ears: No hemotympanum bilaterally. No Battle sign Eyes: No eye injury. PERRL. No raccoon eyes Nose: Nontender. No epistaxis. No rhinorrhea Mouth/Throat: Mucous membranes are moist. No oropharyngeal blood. No dental injury. Airway patent without stridor. Normal voice. Neck: C-collar in place. No midline c-spine tenderness.  Cardiovascular: Normal rate, regular rhythm. Normal and symmetric distal pulses are present in all extremities. Pulmonary/Chest: Chest wall is stable and nontender to palpation/compression. Normal respiratory effort. Breath sounds are normal. No crepitus.  Abdominal: Soft, nontender, non distended. Musculoskeletal: LLE is shortened but not rotated. She has ttp over the proximal posterior femur region. Nontender with normal full range of motion in all other extremities. No deformities. No thoracic or lumbar midline spinal tenderness. Pelvis is stable. Skin: Skin is warm, dry and intact. No abrasions or contutions. Psychiatric: Speech and behavior are appropriate. Neurological: Normal speech and language. Moves all extremities to command. No gross focal neurologic deficits are appreciated.  Glascow Coma Score: 4 - Opens eyes on own 6 - Follows simple motor commands 5 - Alert and oriented GCS: 15   ____________________________________________   LABS (all labs ordered are listed, but only abnormal results are displayed)  Labs Reviewed  CBC  BASIC  METABOLIC PANEL  PROTIME-INR  APTT  TYPE AND SCREEN   ____________________________________________  EKG  ED ECG REPORT I, Rudene Re, the attending physician, personally viewed and interpreted this ECG.  Normal sinus rhythm, rate of 94, normal intervals, normal axis, no ST elevations or depressions.  ____________________________________________  RADIOLOGY  XR hip:  Nondisplaced fracture through the left femoral neck.  Head CT: No CT evidence of acute intracranial abnormality. Evidence of chronic microvascular ischemic disease with associated intracranial atherosclerosis.  CXR: No active disease.   ____________________________________________   PROCEDURES  Procedure(s) performed: None Procedures Critical Care performed:  None ____________________________________________   INITIAL IMPRESSION / ASSESSMENT AND PLAN / ED COURSE  83 y.o. female with h/o TIA on aggrenox who presents for evaluation of left hip pain status post mechanical fall. Patient is fully undressed with no additional injuries found on exam. She has tenderness palpation over the proximal lateral/posterior femur with no deformities, her leg is shortened however not rotated. X-ray has been ordered to rule out fracture. Patient was given 50 g of fentanyl for pain. Patient is neurologically intact with no head trauma or LOC however since she is on blood thinners will do Ct head.     _________________________ 3:26 PM on 08/08/2016 -----------------------------------------  X-ray showing a left hip fracture. Discussed with Dr. Marry Guan, ortho who  will plan to do operative repair tomorrow. Patient and family updated. Patient be admitted to the hospitalist service. Surgical labs, Foley catheter, pain medicine, and EKG have all been done.  Pertinent labs & imaging results that were available during my care of the patient were reviewed by me and considered in my medical decision making (see chart for  details).    ____________________________________________   FINAL CLINICAL IMPRESSION(S) / ED DIAGNOSES  Final diagnoses:  Closed fracture of left hip, initial encounter ([place])      NEW MEDICATIONS STARTED DURING THIS VISIT:  New Prescriptions   No medications on file     Note:  This document was prepared using Dragon voice recognition software and may include unintentional dictation errors.    Alfred Levins, [place], MD 08/08/16 [phone]

## 2016-08-08 NOTE — H&P (Signed)
[facility] [place] NAME: [name]    MR#:  [id number]  DATE OF BIRTH:  [birth year]  DATE OF ADMISSION:  08/08/2016  PRIMARY CARE PHYSICIAN: [name], MD   REQUESTING/REFERRING PHYSICIAN: Alfred Levins MD  CHIEF COMPLAINT:   Chief Complaint  Patient presents with  . Fall    HISTORY OF PRESENT ILLNESS: [name]  is a 83 y.o. female with a known history of  Osteoarthritis, cva, hyperlipidemia is here with fall. Patient presented to the emergency room with these symptoms and is noted to have a hip fracture. She reports that this was a mechanical fall. She reports no syncope, no shortness of breath no chest pain.    PAST MEDICAL HISTORY:   Past Medical History:  Diagnosis Date  . Arthritis   . High cholesterol 02/29/2016  . Stroke ([place])   . TIA (transient ischemic attack) 02/29/2016    PAST SURGICAL HISTORY:  Past Surgical History:  Procedure Laterality Date  . APPENDECTOMY    . CATARACT EXTRACTION W/PHACO Right 11/22/2014   Procedure: CATARACT EXTRACTION PHACO AND INTRAOCULAR LENS PLACEMENT ([place]);  Surgeon: Rutherford Guys, MD;  Location: [facility] ORS;  Service: Ophthalmology;  Laterality: Right;  CDE:5.05  . CATARACT EXTRACTION W/PHACO Left 12/13/2014   Procedure: CATARACT EXTRACTION PHACO AND INTRAOCULAR LENS PLACEMENT (IOC);  Surgeon: Rutherford Guys, MD;  Location: [facility] ORS;  Service: Ophthalmology;  Laterality: Left;  CDE:7.17  . COLONOSCOPY N/A 03/11/2016   Procedure: COLONOSCOPY;  Surgeon: Rogene Houston, MD;  Location: [facility];  Service: Endoscopy;  Laterality: N/A;  7:30  . ESOPHAGOGASTRODUODENOSCOPY N/A 03/11/2016   Procedure: ESOPHAGOGASTRODUODENOSCOPY (EGD);  Surgeon: Rogene Houston, MD;  Location[facility];  Service: Endoscopy;  Laterality: N/A;    SOCIAL HISTORY:  Social History  Substance Use Topics  . Smoking status: Never Smoker  . Smokeless tobacco: Never Used  . Alcohol use No    FAMILY HISTORY: History  reviewed. No pertinent family history.  DRUG ALLERGIES: No Known Allergies  REVIEW OF SYSTEMS:   CONSTITUTIONAL: No fever, fatigue or weakness.  EYES: No blurred or double vision.  EARS, NOSE, AND THROAT: No tinnitus or ear pain.  RESPIRATORY: No cough, shortness of breath, wheezing or hemoptysis.  CARDIOVASCULAR: No chest pain, orthopnea, edema.  GASTROINTESTINAL: No nausea, vomiting, diarrhea or abdominal pain.  GENITOURINARY: No dysuria, hematuria.  ENDOCRINE: No polyuria, nocturia,  HEMATOLOGY: No anemia, easy bruising or bleeding SKIN: No rash or lesion. MUSCULOSKELETAL: Positive for hip pain.   NEUROLOGIC: No tingling, numbness, weakness.  PSYCHIATRY: No anxiety or depression.   MEDICATIONS AT HOME:  Prior to Admission medications   Medication Sig Start Date End Date Taking? Authorizing Provider  dipyridamole-aspirin (AGGRENOX) 200-25 MG 12hr capsule Take 1 capsule by mouth 2 (two) times daily. 03/12/16  Yes Rehman, Mechele Dawley, MD  Multiple Vitamin (MULTIVITAMIN WITH MINERALS) TABS tablet Take 1 tablet by mouth daily.   Yes [provider]  simvastatin (ZOCOR) 20 MG tablet Take 20 mg by mouth every morning.   Yes [provider]  bismuth-metronidazole-tetracycline (PYLERA) [phone] MG capsule Take 3 capsules by mouth 4 (four) times daily -  before meals and at bedtime. Patient not taking: Reported on 08/08/2016 03/15/16   Rogene Houston, MD      PHYSICAL EXAMINATION:   VITAL SIGNS: Blood pressure 100/87, pulse 87, temperature 98.1 F (36.7 C), temperature source Oral, resp. rate 16, height 5\' 3"  (1.6 m), weight 100 lb (45.4 kg),  SpO2 100 %.  GENERAL:  83 y.o.-year-old patient lying in the bed with no acute distress.  EYES: Pupils equal, round, reactive to light and accommodation. No scleral icterus. Extraocular muscles intact.  HEENT: Head atraumatic, normocephalic. Oropharynx and nasopharynx clear.  NECK:  Supple, no jugular venous distention. No  thyroid enlargement, no tenderness.  LUNGS: Normal breath sounds bilaterally, no wheezing, rales,rhonchi or crepitation. No use of accessory muscles of respiration.  CARDIOVASCULAR: S1, S2 normal. No murmurs, rubs, or gallops.  ABDOMEN: Soft, nontender, nondistended. Bowel sounds present. No organomegaly or mass.  EXTREMITIES: No pedal edema, cyanosis, or clubbing.  NEUROLOGIC: Cranial nerves II through XII are intact. Muscle strength 5/5 in all extremities. Sensation intact. Gait not checked.  PSYCHIATRIC: The patient is alert and oriented x 3.  SKIN: No obvious rash, lesion, or ulcer.   LABORATORY PANEL:   CBC No results for input(s): WBC, HGB, HCT, PLT, MCV, MCH, MCHC, RDW, LYMPHSABS, MONOABS, EOSABS, BASOSABS, BANDABS in the last 168 hours.  Invalid input(s): NEUTRABS, BANDSABD ------------------------------------------------------------------------------------------------------------------  Chemistries  No results for input(s): NA, K, CL, CO2, GLUCOSE, BUN, CREATININE, CALCIUM, MG, AST, ALT, ALKPHOS, BILITOT in the last 168 hours.  Invalid input(s): GFRCGP ------------------------------------------------------------------------------------------------------------------ CrCl cannot be calculated (Patient's most recent lab result is older than the maximum 21 days allowed.). ------------------------------------------------------------------------------------------------------------------ No results for input(s): TSH, T4TOTAL, T3FREE, THYROIDAB in the last 72 hours.  Invalid input(s): FREET3   Coagulation profile No results for input(s): INR, PROTIME in the last 168 hours. ------------------------------------------------------------------------------------------------------------------- No results for input(s): DDIMER in the last 72 hours. -------------------------------------------------------------------------------------------------------------------  Cardiac Enzymes No  results for input(s): CKMB, TROPONINI, MYOGLOBIN in the last 168 hours.  Invalid input(s): CK ------------------------------------------------------------------------------------------------------------------ Invalid input(s): POCBNP  ---------------------------------------------------------------------------------------------------------------  Urinalysis No results found for: COLORURINE, APPEARANCEUR, LABSPEC, PHURINE, GLUCOSEU, HGBUR, BILIRUBINUR, KETONESUR, PROTEINUR, UROBILINOGEN, NITRITE, LEUKOCYTESUR   RADIOLOGY: Ct Head Wo Contrast  Result Date: 08/08/2016 CLINICAL DATA:  83 year old female with a history of fall EXAM: CT HEAD WITHOUT CONTRAST TECHNIQUE: Contiguous axial images were obtained from the base of the skull through the vertex without intravenous contrast. COMPARISON:  MR head 10/07/2008 FINDINGS: Brain: No acute intracranial hemorrhage. No midline shift or mass effect. Diffuse brain volume loss. Periventricular white matter hypodensity. Unremarkable configuration the ventricles. Vascular: Calcifications of the intracranial vasculature. Skull: No aggressive bone lesion.  No fracture. Sinuses/Orbits: Unremarkable Other: None IMPRESSION: No CT evidence of acute intracranial abnormality. Evidence of chronic microvascular ischemic disease with associated intracranial atherosclerosis. Electronically Signed   By: Corrie Mckusick D.O.   On: 08/08/2016 15:08   Dg Chest Portable 1 View  Result Date: 08/08/2016 CLINICAL DATA:  Preoperative evaluation EXAM: PORTABLE CHEST 1 VIEW COMPARISON:  02/10/2014 FINDINGS: The heart size and mediastinal contours are within normal limits. Both lungs are clear. The visualized skeletal structures are unremarkable. IMPRESSION: No active disease. Electronically Signed   By: Inez Catalina M.D.   On: 08/08/2016 15:01   Dg Hip Unilat W Or Wo Pelvis 2-3 Views Left  Result Date: 08/08/2016 CLINICAL DATA:  Pain after fall. EXAM: DG HIP (WITH OR WITHOUT PELVIS)  2-3V LEFT COMPARISON:  None. FINDINGS: There is a nondisplaced fracture through the left femoral neck without dislocation. IMPRESSION: Nondisplaced fracture through the left femoral neck. Electronically Signed   By: Dorise Bullion III M.D   On: 08/08/2016 15:16    EKG: Orders placed or performed during the hospital encounter of 08/08/16  . ED EKG  . ED EKG  . EKG 12-Lead  . EKG 12-Lead  IMPRESSION AND PLAN: Patient is a 83 year old with a fall with a hip pain 1. Left femoral neck fracture, preoperatively patient is at moderate risk of surgical complications Currently he has no cardiopulmonary symptoms no further cardiopulmonary workup needed okay to proceed to surgery Emergency room physician has discussed the case with orthopedics there will be operating on the patient tomorrow  2. h/o cva - hold aggenox  3. Hyperlipidemia continue simvastatin  4. misc scd's for dvt prop   All the records are reviewed and case discussed with ED provider. Management plans discussed with the patient, family and they are in agreement.  CODE STATUS: Code Status History    This patient does not have a recorded code status. Please follow your organizational policy for patients in this situation.       TOTAL TIME TAKING CARE OF THIS PATIENT: 50 minutes.    Dustin Flock M.D on 08/08/2016 at 3:24 PM  Between 7am to 6pm - Pager - [phone]  After 6pm go to [email or web address] - password [email or web address] [facility]  [facility]  Office  [phone]  CC: Primary care physician; [name], MD

## 2016-08-08 NOTE — ED Triage Notes (Signed)
Pt via ems from home; She fell in the yard trying to lift something heavy. Fell onto left hip. Pt denies loc, head injury, dizziness. Pt alert & oriented with NAD noted.

## 2016-08-09 ENCOUNTER — Encounter: Payer: Self-pay | Admitting: Anesthesiology

## 2016-08-09 ENCOUNTER — Inpatient Hospital Stay: Payer: Medicare HMO

## 2016-08-09 ENCOUNTER — Inpatient Hospital Stay: Payer: Medicare HMO | Admitting: Anesthesiology

## 2016-08-09 ENCOUNTER — Encounter
Admission: EM | Disposition: A | Discharge status: Home Health | Payer: Self-pay | Source: Home / Self Care | Attending: Internal Medicine

## 2016-08-09 HISTORY — PX: HIP ARTHROPLASTY: SHX981

## 2016-08-09 LAB — BASIC METABOLIC PANEL
ANION GAP: 6 (ref 5–15)
BUN: 15 mg/dL (ref 6–20)
CHLORIDE: 108 mmol/L (ref 101–111)
CO2: 25 mmol/L (ref 22–32)
Calcium: 9.2 mg/dL (ref 8.9–10.3)
Creatinine, Ser: 0.77 mg/dL (ref 0.44–1.00)
GFR calc non Af Amer: >60 mL/min (ref >60)
Glucose, Bld: 106 mg/dL — ABNORMAL HIGH (ref 65–99)
POTASSIUM: 3.9 mmol/L (ref 3.5–5.1)
SODIUM: 139 mmol/L (ref 135–145)

## 2016-08-09 LAB — CBC
HCT: 33.3 % — ABNORMAL LOW (ref 35.0–47.0)
Hemoglobin: 11.3 g/dL — ABNORMAL LOW (ref 12.0–16.0)
MCH: 33.2 pg (ref 26.0–34.0)
MCHC: 34 g/dL (ref 32.0–36.0)
MCV: 97.7 fL (ref 80.0–100.0)
Platelets: 224 10*3/uL (ref 150–440)
RBC: 3.41 MIL/uL — AB (ref 3.80–5.20)
RDW: 13.1 % (ref 11.5–14.5)
WBC: 11.2 10*3/uL — ABNORMAL HIGH (ref 3.6–11.0)

## 2016-08-09 LAB — SURGICAL PCR SCREEN
MRSA, PCR: NEGATIVE
STAPHYLOCOCCUS AUREUS: NEGATIVE

## 2016-08-09 SURGERY — HEMIARTHROPLASTY, HIP, DIRECT ANTERIOR APPROACH, FOR FRACTURE
Anesthesia: General | Site: Hip | Laterality: Left | Wound class: Clean

## 2016-08-09 MED ORDER — TRAMADOL HCL 50 MG PO TABS
50.0000 mg | ORAL_TABLET | ORAL | Status: DC | PRN
  Administered 2016-08-11: 50 mg via ORAL
  Filled 2016-08-09: qty 1

## 2016-08-09 MED ORDER — ONDANSETRON HCL 4 MG/2ML IJ SOLN
INTRAMUSCULAR | Status: AC
  Filled 2016-08-09: qty 2

## 2016-08-09 MED ORDER — ACETAMINOPHEN 10 MG/ML IV SOLN
INTRAVENOUS | Status: DC | PRN
  Administered 2016-08-09: 1000 mg via INTRAVENOUS

## 2016-08-09 MED ORDER — DEXTROSE 5 % IV SOLN
2.0000 g | Freq: Four times a day (QID) | INTRAVENOUS | Status: AC
  Administered 2016-08-10 (×4): 2 g via INTRAVENOUS
  Filled 2016-08-09 (×4): qty 2000

## 2016-08-09 MED ORDER — BISACODYL 10 MG RE SUPP: 10.0000 mg | Freq: Every day | RECTAL | Status: DC | PRN

## 2016-08-09 MED ORDER — FENTANYL CITRATE (PF) 100 MCG/2ML IJ SOLN
INTRAMUSCULAR | Status: DC | PRN
  Administered 2016-08-09 (×2): 50 ug via INTRAVENOUS

## 2016-08-09 MED ORDER — ONDANSETRON HCL 4 MG PO TABS: 4.0000 mg | ORAL_TABLET | Freq: Four times a day (QID) | ORAL | Status: DC | PRN

## 2016-08-09 MED ORDER — FENTANYL CITRATE (PF) 100 MCG/2ML IJ SOLN
INTRAMUSCULAR | Status: AC
  Filled 2016-08-09: qty 2

## 2016-08-09 MED ORDER — ROCURONIUM BROMIDE 100 MG/10ML IV SOLN
INTRAVENOUS | Status: AC
  Filled 2016-08-09: qty 1

## 2016-08-09 MED ORDER — PROPOFOL 500 MG/50ML IV EMUL
INTRAVENOUS | Status: AC
  Filled 2016-08-09: qty 50

## 2016-08-09 MED ORDER — ACETAMINOPHEN 10 MG/ML IV SOLN
1000.0000 mg | Freq: Four times a day (QID) | INTRAVENOUS | Status: AC
  Administered 2016-08-10 (×4): 1000 mg via INTRAVENOUS
  Filled 2016-08-09 (×4): qty 100

## 2016-08-09 MED ORDER — ENSURE ENLIVE PO LIQD
237.0000 mL | ORAL | Status: DC
  Administered 2016-08-11: 237 mL via ORAL

## 2016-08-09 MED ORDER — SUGAMMADEX SODIUM 200 MG/2ML IV SOLN
INTRAVENOUS | Status: AC
  Filled 2016-08-09: qty 2

## 2016-08-09 MED ORDER — PANTOPRAZOLE SODIUM 40 MG PO TBEC
40.0000 mg | DELAYED_RELEASE_TABLET | Freq: Two times a day (BID) | ORAL | Status: DC
  Administered 2016-08-10 – 2016-08-11 (×3): 40 mg via ORAL
  Filled 2016-08-09 (×3): qty 1

## 2016-08-09 MED ORDER — SODIUM CHLORIDE 0.9 % IJ SOLN
INTRAMUSCULAR | Status: AC
  Filled 2016-08-09: qty 10

## 2016-08-09 MED ORDER — LIDOCAINE HCL (PF) 2 % IJ SOLN
INTRAMUSCULAR | Status: AC
  Filled 2016-08-09: qty 2

## 2016-08-09 MED ORDER — SODIUM CHLORIDE 0.9 % IV SOLN
INTRAVENOUS | Status: DC
  Administered 2016-08-09 – 2016-08-10 (×3): via INTRAVENOUS

## 2016-08-09 MED ORDER — METOCLOPRAMIDE HCL 5 MG/ML IJ SOLN: 5.0000 mg | Freq: Three times a day (TID) | INTRAMUSCULAR | Status: DC | PRN

## 2016-08-09 MED ORDER — DEXAMETHASONE SODIUM PHOSPHATE 10 MG/ML IJ SOLN
INTRAMUSCULAR | Status: AC
  Filled 2016-08-09: qty 1

## 2016-08-09 MED ORDER — SENNOSIDES-DOCUSATE SODIUM 8.6-50 MG PO TABS
1.0000 | ORAL_TABLET | Freq: Two times a day (BID) | ORAL | Status: DC
  Administered 2016-08-10 (×2): 1 via ORAL
  Filled 2016-08-09 (×3): qty 1

## 2016-08-09 MED ORDER — MAGNESIUM HYDROXIDE 400 MG/5ML PO SUSP
30.0000 mL | Freq: Every day | ORAL | Status: DC | PRN
  Administered 2016-08-11: 30 mL via ORAL
  Filled 2016-08-09: qty 30

## 2016-08-09 MED ORDER — CEFAZOLIN SODIUM-DEXTROSE 2-4 GM/100ML-% IV SOLN: 2.0000 g | Freq: Four times a day (QID) | INTRAVENOUS | Status: DC

## 2016-08-09 MED ORDER — DEXAMETHASONE SODIUM PHOSPHATE 10 MG/ML IJ SOLN
INTRAMUSCULAR | Status: DC | PRN
  Administered 2016-08-09: 5 mg via INTRAVENOUS

## 2016-08-09 MED ORDER — FERROUS SULFATE 325 (65 FE) MG PO TABS
325.0000 mg | ORAL_TABLET | Freq: Two times a day (BID) | ORAL | Status: DC
  Administered 2016-08-10 – 2016-08-11 (×3): 325 mg via ORAL
  Filled 2016-08-09 (×3): qty 1

## 2016-08-09 MED ORDER — PROPOFOL 10 MG/ML IV BOLUS
INTRAVENOUS | Status: AC
  Filled 2016-08-09: qty 20

## 2016-08-09 MED ORDER — ONDANSETRON HCL 4 MG/2ML IJ SOLN
4.0000 mg | Freq: Four times a day (QID) | INTRAMUSCULAR | Status: DC | PRN
  Administered 2016-08-09: 4 mg via INTRAVENOUS
  Filled 2016-08-09: qty 2

## 2016-08-09 MED ORDER — METOCLOPRAMIDE HCL 10 MG PO TABS
10.0000 mg | ORAL_TABLET | Freq: Three times a day (TID) | ORAL | Status: DC
  Administered 2016-08-10 – 2016-08-11 (×5): 10 mg via ORAL
  Filled 2016-08-09 (×5): qty 1

## 2016-08-09 MED ORDER — LIDOCAINE HCL (CARDIAC) 20 MG/ML IV SOLN
INTRAVENOUS | Status: DC | PRN
  Administered 2016-08-09: 60 mg via INTRAVENOUS

## 2016-08-09 MED ORDER — PROPOFOL 10 MG/ML IV BOLUS
INTRAVENOUS | Status: DC | PRN
  Administered 2016-08-09: 50 mg via INTRAVENOUS
  Administered 2016-08-09: 20 mg via INTRAVENOUS

## 2016-08-09 MED ORDER — SUCCINYLCHOLINE CHLORIDE 20 MG/ML IJ SOLN
INTRAMUSCULAR | Status: AC
  Filled 2016-08-09: qty 1

## 2016-08-09 MED ORDER — ACETAMINOPHEN 650 MG RE SUPP: 650.0000 mg | Freq: Four times a day (QID) | RECTAL | Status: DC | PRN

## 2016-08-09 MED ORDER — ACETAMINOPHEN 325 MG PO TABS: 650.0000 mg | ORAL_TABLET | Freq: Four times a day (QID) | ORAL | Status: DC | PRN

## 2016-08-09 MED ORDER — MENTHOL 3 MG MT LOZG
1.0000 | LOZENGE | OROMUCOSAL | Status: DC | PRN
  Filled 2016-08-09: qty 9

## 2016-08-09 MED ORDER — OXYCODONE HCL 5 MG PO TABS: 5.0000 mg | ORAL_TABLET | ORAL | Status: DC | PRN

## 2016-08-09 MED ORDER — METOCLOPRAMIDE HCL 10 MG PO TABS: 5.0000 mg | ORAL_TABLET | Freq: Three times a day (TID) | ORAL | Status: DC | PRN

## 2016-08-09 MED ORDER — SUCCINYLCHOLINE CHLORIDE 20 MG/ML IJ SOLN
INTRAMUSCULAR | Status: DC | PRN
  Administered 2016-08-09: 60 mg via INTRAVENOUS

## 2016-08-09 MED ORDER — NEOMYCIN-POLYMYXIN B GU 40-200000 IR SOLN
Status: AC
  Filled 2016-08-09: qty 20

## 2016-08-09 MED ORDER — ENOXAPARIN SODIUM 40 MG/0.4ML ~~LOC~~ SOLN
40.0000 mg | SUBCUTANEOUS | Status: DC
  Administered 2016-08-10 – 2016-08-11 (×2): 40 mg via SUBCUTANEOUS
  Filled 2016-08-09 (×2): qty 0.4

## 2016-08-09 MED ORDER — FLEET ENEMA 7-19 GM/118ML RE ENEM: 1.0000 | ENEMA | Freq: Once | RECTAL | Status: DC | PRN

## 2016-08-09 MED ORDER — FENTANYL CITRATE (PF) 100 MCG/2ML IJ SOLN: 25.0000 ug | INTRAMUSCULAR | Status: DC | PRN

## 2016-08-09 MED ORDER — MORPHINE SULFATE (PF) 2 MG/ML IV SOLN: 2.0000 mg | INTRAVENOUS | Status: DC | PRN

## 2016-08-09 MED ORDER — PHENYLEPHRINE HCL 10 MG/ML IJ SOLN
INTRAMUSCULAR | Status: DC | PRN
  Administered 2016-08-09: 50 ug via INTRAVENOUS
  Administered 2016-08-09: 100 ug via INTRAVENOUS
  Administered 2016-08-09: 50 ug via INTRAVENOUS
  Administered 2016-08-09: 100 ug via INTRAVENOUS
  Administered 2016-08-09: 50 ug via INTRAVENOUS

## 2016-08-09 MED ORDER — PHENOL 1.4 % MT LIQD
1.0000 | OROMUCOSAL | Status: DC | PRN
  Filled 2016-08-09: qty 177

## 2016-08-09 MED ORDER — ACETAMINOPHEN 10 MG/ML IV SOLN
INTRAVENOUS | Status: AC
  Filled 2016-08-09: qty 100

## 2016-08-09 MED ORDER — SUGAMMADEX SODIUM 200 MG/2ML IV SOLN
INTRAVENOUS | Status: DC | PRN
  Administered 2016-08-09: 100 mg via INTRAVENOUS

## 2016-08-09 MED ORDER — ROCURONIUM BROMIDE 100 MG/10ML IV SOLN
INTRAVENOUS | Status: DC | PRN
  Administered 2016-08-09: 20 mg via INTRAVENOUS
  Administered 2016-08-09: 30 mg via INTRAVENOUS

## 2016-08-09 MED ORDER — HYDROMORPHONE HCL 1 MG/ML IJ SOLN
INTRAMUSCULAR | Status: AC
  Filled 2016-08-09: qty 1

## 2016-08-09 MED ORDER — HYDROMORPHONE HCL 1 MG/ML IJ SOLN
INTRAMUSCULAR | Status: DC | PRN
  Administered 2016-08-09: .4 mg via INTRAVENOUS

## 2016-08-09 MED ORDER — ONDANSETRON HCL 4 MG/2ML IJ SOLN
INTRAMUSCULAR | Status: DC | PRN
Start: 2016-08-09 — End: 2016-08-09
  Administered 2016-08-09: 4 mg via INTRAVENOUS

## 2016-08-09 MED ORDER — ONDANSETRON HCL 4 MG/2ML IJ SOLN: 4.0000 mg | Freq: Once | INTRAMUSCULAR | Status: DC | PRN

## 2016-08-09 MED ORDER — PHENYLEPHRINE HCL 10 MG/ML IJ SOLN
INTRAMUSCULAR | Status: AC
  Filled 2016-08-09: qty 1

## 2016-08-09 SURGICAL SUPPLY — 61 items
BAG DECANTER FOR FLEXI CONT (MISCELLANEOUS) ×2 IMPLANT
BLADE SAW 1 (BLADE) ×2 IMPLANT
CANISTER SUCT 1200ML W/VALVE (MISCELLANEOUS) ×2 IMPLANT
CANISTER SUCT 3000ML PPV (MISCELLANEOUS) ×4 IMPLANT
CAPT HIP HEMI 1 ×1 IMPLANT
CATH FOL LEG HOLDER (MISCELLANEOUS) ×2 IMPLANT
CEMENT HV SMART SET (Cement) ×4 IMPLANT
DRAPE INCISE IOBAN 66X60 STRL (DRAPES) ×2 IMPLANT
DRAPE SHEET LG 3/4 BI-LAMINATE (DRAPES) ×2 IMPLANT
DRAPE TABLE BACK 80X90 (DRAPES) ×2 IMPLANT
DRSG DERMACEA 8X12 NADH (GAUZE/BANDAGES/DRESSINGS) ×2 IMPLANT
DRSG OPSITE POSTOP 4X12 (GAUZE/BANDAGES/DRESSINGS) ×2 IMPLANT
DRSG OPSITE POSTOP 4X14 (GAUZE/BANDAGES/DRESSINGS) ×2 IMPLANT
DRSG TEGADERM 4X4.75 (GAUZE/BANDAGES/DRESSINGS) ×2 IMPLANT
DURAPREP 26ML APPLICATOR (WOUND CARE) ×4 IMPLANT
ELECT BLADE 6.5 EXT (BLADE) ×2 IMPLANT
ELECT CAUTERY BLADE 6.4 (BLADE) ×2 IMPLANT
ELECT REM PT RETURN 9FT ADLT (ELECTROSURGICAL) ×2
ELECTRODE REM PT RTRN 9FT ADLT (ELECTROSURGICAL) ×1 IMPLANT
GAUZE PACK 2X3YD (MISCELLANEOUS) ×2 IMPLANT
GLOVE BIO SURGEON STRL SZ8 (GLOVE) ×2 IMPLANT
GLOVE BIOGEL M STRL SZ7.5 (GLOVE) ×4 IMPLANT
GLOVE BIOGEL PI IND STRL 9 (GLOVE) ×1 IMPLANT
GLOVE BIOGEL PI INDICATOR 9 (GLOVE) ×1
GLOVE INDICATOR 8.0 STRL GRN (GLOVE) ×2 IMPLANT
GOWN STRL REUS W/ TWL LRG LVL3 (GOWN DISPOSABLE) ×2 IMPLANT
GOWN STRL REUS W/TWL 2XL LVL3 (GOWN DISPOSABLE) ×2 IMPLANT
GOWN STRL REUS W/TWL LRG LVL3 (GOWN DISPOSABLE) ×4
HEMOVAC 400CC 10FR (MISCELLANEOUS) ×2 IMPLANT
HOOD PEEL AWAY FLYTE STAYCOOL (MISCELLANEOUS) ×4 IMPLANT
IV NS 100ML SINGLE PACK (IV SOLUTION) ×2 IMPLANT
KIT RM TURNOVER STRD PROC AR (KITS) ×2 IMPLANT
NDL SAFETY 18GX1.5 (NEEDLE) ×2 IMPLANT
NDL SAFETY ECLIPSE 18X1.5 (NEEDLE) IMPLANT
NEEDLE FILTER BLUNT 18X 1/2SAF (NEEDLE) ×2
NEEDLE FILTER BLUNT 18X1 1/2 (NEEDLE) ×2 IMPLANT
NEEDLE HYPO 18GX1.5 SHARP (NEEDLE) ×2
NS IRRIG 1000ML POUR BTL (IV SOLUTION) ×2 IMPLANT
PACK HIP PROSTHESIS (MISCELLANEOUS) ×2 IMPLANT
PRESSURIZER CEMENT PROX FEM SM (MISCELLANEOUS) ×4 IMPLANT
PRESSURIZER FEM CANAL M (MISCELLANEOUS) ×2 IMPLANT
PULSAVAC PLUS IRRIG FAN TIP (DISPOSABLE) ×2
SOL .9 NS 3000ML IRR  AL (IV SOLUTION) ×1
SOL .9 NS 3000ML IRR AL (IV SOLUTION) ×1
SOL .9 NS 3000ML IRR UROMATIC (IV SOLUTION) ×1 IMPLANT
SOL PREP PVP 2OZ (MISCELLANEOUS) ×2
SOLUTION PREP PVP 2OZ (MISCELLANEOUS) ×1 IMPLANT
SPONGE DRAIN TRACH 4X4 STRL 2S (GAUZE/BANDAGES/DRESSINGS) ×2 IMPLANT
STAPLER SKIN PROX 35W (STAPLE) ×2 IMPLANT
SUT ETHIBOND #5 BRAIDED 30INL (SUTURE) ×2 IMPLANT
SUT VIC AB 0 CT1 36 (SUTURE) ×2 IMPLANT
SUT VIC AB 1 CT1 36 (SUTURE) ×4 IMPLANT
SUT VIC AB 2-0 CT1 27 (SUTURE) ×2
SUT VIC AB 2-0 CT1 TAPERPNT 27 (SUTURE) ×1 IMPLANT
SYR 20CC LL (SYRINGE) ×4 IMPLANT
SYR 3ML LL SCALE MARK (SYRINGE) ×1 IMPLANT
SYR TB 1ML LUER SLIP (SYRINGE) ×2 IMPLANT
TAPE TRANSPORE STRL 2 31045 (GAUZE/BANDAGES/DRESSINGS) ×2 IMPLANT
TIP BRUSH PULSAVAC PLUS 24.33 (MISCELLANEOUS) ×2 IMPLANT
TIP FAN IRRIG PULSAVAC PLUS (DISPOSABLE) ×1 IMPLANT
TOWER CARTRIDGE SMART MIX (DISPOSABLE) ×3 IMPLANT

## 2016-08-09 NOTE — Progress Notes (Signed)
Patient to OR with family at bedside.

## 2016-08-09 NOTE — Progress Notes (Addendum)
[facility] [place] NAME: Orianna Biskup    MR#:  [id number]  DATE OF BIRTH:  [birth year]  SUBJECTIVE:   Patient came in after she had a mechanical fall while trying to heavy chest of drawers with her husband at home. Complains of hip pain. Awaiting surgery.  REVIEW OF SYSTEMS:   Review of Systems  Constitutional: Negative for chills, fever and weight loss.  HENT: Negative for ear discharge, ear pain and nosebleeds.   Eyes: Negative for blurred vision, pain and discharge.  Respiratory: Negative for sputum production, shortness of breath, wheezing and stridor.   Cardiovascular: Negative for chest pain, palpitations, orthopnea and PND.  Gastrointestinal: Negative for abdominal pain, diarrhea, nausea and vomiting.  Genitourinary: Negative for frequency and urgency.  Musculoskeletal: Positive for joint pain. Negative for back pain.  Neurological: Positive for weakness. Negative for sensory change, speech change and focal weakness.  Psychiatric/Behavioral: Negative for depression and hallucinations. The patient is not nervous/anxious.    Tolerating Diet: Tolerating PT:   DRUG ALLERGIES:  No Known Allergies  VITALS:  Blood pressure (!) 100/48, pulse 88, temperature 98.8 F (37.1 C), temperature source Oral, resp. rate 18, height 5\' 3"  (1.6 m), weight 45.4 kg (100 lb), SpO2 98 %.  PHYSICAL EXAMINATION:   Physical Exam  GENERAL:  83 y.o.-year-old patient lying in the bed with no acute distress.  EYES: Pupils equal, round, reactive to light and accommodation. No scleral icterus. Extraocular muscles intact.  HEENT: Head atraumatic, normocephalic. Oropharynx and nasopharynx clear.  NECK:  Supple, no jugular venous distention. No thyroid enlargement, no tenderness.  LUNGS: Normal breath sounds bilaterally, no wheezing, rales, rhonchi. No use of accessory muscles of respiration.  CARDIOVASCULAR: S1, S2 normal. No murmurs, rubs, or gallops.   ABDOMEN: Soft, nontender, nondistended. Bowel sounds present. No organomegaly or mass.  EXTREMITIES: No cyanosis, clubbing or edema b/l.   Left LE buck's traction NEUROLOGIC: Cranial nerves II through XII are intact. No focal Motor or sensory deficits b/l.   PSYCHIATRIC:  patient is alert and oriented x 3.  SKIN: No obvious rash, lesion, or ulcer.   LABORATORY PANEL:  CBC  Recent Labs Lab 08/09/16 0445  WBC 11.2*  HGB 11.3*  HCT 33.3*  PLT 224    Chemistries   Recent Labs Lab 08/09/16 0445  NA 139  K 3.9  CL 108  CO2 25  GLUCOSE 106*  BUN 15  CREATININE 0.77  CALCIUM 9.2   Cardiac Enzymes No results for input(s): TROPONINI in the last 168 hours. RADIOLOGY:  Ct Head Wo Contrast  Result Date: 08/08/2016 CLINICAL DATA:  83 year old female with a history of fall EXAM: CT HEAD WITHOUT CONTRAST TECHNIQUE: Contiguous axial images were obtained from the base of the skull through the vertex without intravenous contrast. COMPARISON:  MR head 10/07/2008 FINDINGS: Brain: No acute intracranial hemorrhage. No midline shift or mass effect. Diffuse brain volume loss. Periventricular white matter hypodensity. Unremarkable configuration the ventricles. Vascular: Calcifications of the intracranial vasculature. Skull: No aggressive bone lesion.  No fracture. Sinuses/Orbits: Unremarkable Other: None IMPRESSION: No CT evidence of acute intracranial abnormality. Evidence of chronic microvascular ischemic disease with associated intracranial atherosclerosis. Electronically Signed   By: Corrie Mckusick D.O.   On: 08/08/2016 15:08   Dg Chest Portable 1 View  Result Date: 08/08/2016 CLINICAL DATA:  Preoperative evaluation EXAM: PORTABLE CHEST 1 VIEW COMPARISON:  02/10/2014 FINDINGS: The heart size and mediastinal contours are within normal limits. Both lungs are  clear. The visualized skeletal structures are unremarkable. IMPRESSION: No active disease. Electronically Signed   By: Inez Catalina M.D.   On:  08/08/2016 15:01   Dg Hip Unilat W Or Wo Pelvis 2-3 Views Left  Result Date: 08/08/2016 CLINICAL DATA:  Pain after fall. EXAM: DG HIP (WITH OR WITHOUT PELVIS) 2-3V LEFT COMPARISON:  None. FINDINGS: There is a nondisplaced fracture through the left femoral neck without dislocation. IMPRESSION: Nondisplaced fracture through the left femoral neck. Electronically Signed   By: Dorise Bullion III M.D   On: 08/08/2016 15:16   ASSESSMENT AND PLAN:  Lorri Fukuhara  is a 83 y.o. female with a known history of  Osteoarthritis, cva, hyperlipidemia is here with fall. Patient presented to the emergency room with these symptoms and is noted to have a hip fracture.   1. Left femoral neck fracture - patient is at moderate risk of surgical complications -Currently he has no cardiopulmonary symptoms no further cardiopulmonary workup needed okay to proceed to surgery -pt see by Dr Marry Guan. inpyt appreciated  2. h/o cva - hold aggenox  3. Hyperlipidemia continue simvastatin  Pt and CSW for d/c planning  D/w pt and family  Case discussed with Care Management/Social Worker. Management plans discussed with the patient, family and they are in agreement.  CODE STATUS: FULL  DVT Prophylaxis: lovenox post op  TOTAL TIME TAKING CARE OF THIS PATIENT: *30* minutes.  >50% time spent on counselling and coordination of care  POSSIBLE D/C IN 2-3 DAYS, DEPENDING ON CLINICAL CONDITION.  Note: This dictation was prepared with Dragon dictation along with smaller phrase technology. Any transcriptional errors that result from this process are unintentional.  Israel Wunder M.D on 08/09/2016 at 1:08 PM  Between 7am to 6pm - Pager - [phone]  After 6pm go to [email or web address] - password [email or web address] [facility]  Office  [phone]  CC: Primary care physician; [name], MD

## 2016-08-09 NOTE — Anesthesia Post-op Follow-up Note (Cosign Needed)
Anesthesia QCDR form completed.        

## 2016-08-09 NOTE — Anesthesia Procedure Notes (Signed)
Procedure Name: Intubation Date/Time: 08/09/2016 6:16 PM Performed by: Hedda Slade Pre-anesthesia Checklist: Patient identified, Patient being monitored, Timeout performed, Emergency Drugs available and Suction available Patient Re-evaluated:Patient Re-evaluated prior to inductionOxygen Delivery Method: Circle system utilized Preoxygenation: Pre-oxygenation with 100% oxygen Intubation Type: IV induction Ventilation: Mask ventilation without difficulty Laryngoscope Size: McGraph and 3 Grade View: Grade I Tube type: Oral Tube size: 7.0 mm Number of attempts: 1 Airway Equipment and Method: Stylet Placement Confirmation: ETT inserted through vocal cords under direct vision,  positive ETCO2 and breath sounds checked- equal and bilateral Secured at: 21 cm Tube secured with: Tape Dental Injury: Teeth and Oropharynx as per pre-operative assessment  Difficulty Due To: Difficult Airway- due to anterior larynx

## 2016-08-09 NOTE — Progress Notes (Signed)
Anderson rounding the unit visited the Pt. Pt's husband and 2 daughters were at the bedside. Pt stated she was going to have a surgery this afternoon. Pt appeared to be relaxed and in good spirit. CH offered prayers for the Pt and for the family. Coleman also informed Pt that Ch serviced were available as needed.    08/09/16 1200  Clinical Encounter Type  Visited With Patient;Patient and family together  Visit Type Initial;Spiritual support  Referral From [place]

## 2016-08-09 NOTE — Transfer of Care (Signed)
Immediate Anesthesia Transfer of Care Note  Patient: [name]  Procedure(s) Performed: Procedure(s): ARTHROPLASTY BIPOLAR HIP (HEMIARTHROPLASTY) (Left)  Patient Location: PACU  Anesthesia Type:General  Level of Consciousness: awake, alert  and oriented  Airway & Oxygen Therapy: Patient Spontanous Breathing and Patient connected to face mask oxygen  Post-op Assessment: Report given to RN and Post -op Vital signs reviewed and stable  Post vital signs: Reviewed and stable  Last Vitals:  Vitals:   08/09/16 1552 08/09/16 2129  BP: (!) 118/56 (!) 159/77  Pulse: 88 82  Resp:  15  Temp: 36.7 C 36.3 C    Last Pain:  Vitals:   08/09/16 2129  TempSrc: Temporal  PainSc:       Patients Stated Pain Goal: 3 (47/09/29 5747)  Complications: No apparent anesthesia complications

## 2016-08-09 NOTE — Op Note (Signed)
OPERATIVE NOTE  DATE OF SURGERY:   08/09/2016  PATIENT NAME:  [name]   DOB: 03/11/1933  MRN: [id number]  PRE-OPERATIVE DIAGNOSIS: Left femoral neck fracture  POST-OPERATIVE DIAGNOSIS:  Same  PROCEDURE:  Left hip hemiarthroplasty  SURGEON:  Marciano Sequin. M.D.  ANESTHESIA: general  ESTIMATED BLOOD LOSS: 300 mL  FLUIDS REPLACED: 1200 mL of crystalloid  DRAINS: 2 medium drains to a Hemovac reservoir  IMPLANTS UTILIZED: DePuy size 3 Summit femoral stem (cemented), 11 mm Cementralizer, 44 mm OD Cathcart hip ball, +5  mm tapered spacer, and a size 3 femoral cement restrictor  INDICATIONS FOR SURGERY: [name] is a 83 y.o. year old female who fell and sustained a displaced left femoral neck fracture. After discussion of the risks and benefits of surgical intervention, the patient expressed understanding of the risks benefits and agree with plans for hip hemiarthroplasty.   The risks, benefits, and alternatives were discussed at length including but not limited to the risks of infection, bleeding, nerve injury, stiffness, blood clots, the need for revision surgery, limb length inequality, dislocation, cardiopulmonary complications, among others, and they were willing to proceed.  PROCEDURE IN DETAIL: The patient was brought into the operating room and, after adequate general anesthesia was achieved, patient was placed in a right lateral decubitus position. Axillary roll was placed and all bony prominences were well-padded. The patient's left hip was cleaned and prepped with alcohol and DuraPrep and draped in the usual sterile fashion. A "timeout" was performed as per usual protocol. A lateral curvilinear incision was made gently curving towards the posterior superior iliac spine. The IT band was incised in line with the skin incision and the fibers of the gluteus maximus were split in line. The piriformis tendon was identified, skeletonized, and incised at its insertion to the  proximal femur and reflected posteriorly. A T type posterior capsulotomy was performed. The femoral head was then removed using a corkscrew device. The femoral head was measured using calipers and ring gauges and determined to be 44 mm in diameter.The femoral neck cut was performed using an oscillating saw. The acetabulum was inspected for any bony fragments. The articular surface was in good condition.  Attention was then directed to the proximal femur. A pilot hole for preparation of the proximal femoral canal was created using a high-speed bur. The femoral canal finder was inserted followed by insertion of the conical reamer. Serial broaches were inserted up to a size 3 broach. Calcar region was planed and a trial reduction was performed using a 44 mm OD Cathcart ball with a +5 mm neck length. Good equalization of limb lengths was appreciated and excellent stability was noted both anteriorly and posteriorly. Trial components were removed. The femoral canal was sized and was felt that a size 3 cement restrictor was appropriate. The cement restrictor was inserted to the appropriate depth in the femoral canal was irrigated with copious amounts of fluid using the pulse lavage and suctioned dry. The femoral canal was then packed with vaginal packing soaked in dilute Neo-Synephrine. Polymethylmethacrylate cement was prepared in the usual fashion using a vacuum mixer. Vaginal packing was removed and the canal again irrigated and suctioned dry. The polymethylmethacrylate cement was inserted in retrograde fashion and pressurized. The size 3 Summit femoral component with an 11 mm Cementralizer was positioned and impacted into place. Excess cement was removed using [employer] fast streamer. After adequate curing of the cement, the Morse taper was cleaned and dried. A 44 mm outer diameter Cathcart  hip ball with a +5 mm tapered spacer was placed on the trunnion and impacted into place. The acetabulum was again irrigated and  suctioned dry, making sure to inspect for any residal bony debris. The femoral head was then reduced and placed through a range of motion. Excellent stability was noted both anteriorly and posteriorly. Good equalization of limb lengths was appreciated.   The wound was irrigated with copious amounts of normal saline with antibiotic solution and suctioned dry. Good hemostasis was appreciated. The posterior capsulotomy was repaired using #5 Ethibond. Piriformis tendon was reapproximated to the undersurface of the gluteus medius tendon using #5 Ethibond. Two medium drains were placed in the wound bed and brought out through separate stab incisions to be attached to a Hemovac reservoir. The IT band was reapproximated using interrupted sutures of #1 Vicryl. Subcutaneous tissue was proximal phalanx using first #0 Vicryl followed by #2-0 Vicryl. The skin was closed with skin staples.  The patient tolerated the procedure well and was transported to the recovery room in stable condition.   Marciano Sequin., M.D.

## 2016-08-09 NOTE — Anesthesia Preprocedure Evaluation (Addendum)
Anesthesia Evaluation  Patient identified by MRN, date of birth, ID band Patient awake    Reviewed: Allergy & Precautions, NPO status , Patient's Chart, lab work & pertinent test results, reviewed documented beta blocker date and time   Airway Mallampati: II  TM Distance: >3 FB     Dental  (+) Chipped   Pulmonary           Cardiovascular      Neuro/Psych TIACVA    GI/Hepatic   Endo/Other    Renal/GU      Musculoskeletal  (+) Arthritis ,   Abdominal   Peds  Hematology  (+) anemia ,   Anesthesia Other Findings Past Medical History: No date: Arthritis 02/29/2016: High cholesterol No date: Stroke [facility]) 02/29/2016: TIA (transient ischemic attack)  Past Surgical History: No date: APPENDECTOMY 11/22/2014: CATARACT EXTRACTION W/PHACO Right. No cardiac symptoms.     Comment: Procedure: CATARACT EXTRACTION PHACO AND               INTRAOCULAR LENS PLACEMENT ([place]);  Surgeon:               Rutherford Guys, MD;  Location: [facility] ORS;  Service:               Ophthalmology;  Laterality: Right;  CDE:5.05 12/13/2014: CATARACT EXTRACTION W/PHACO Left     Comment: Procedure: CATARACT EXTRACTION PHACO AND               INTRAOCULAR LENS PLACEMENT ([facility]);  Surgeon:               Rutherford Guys, MD;  Location: [facility] ORS;  Service:               Ophthalmology;  Laterality: Left;  CDE:7.17 03/11/2016: COLONOSCOPY N/A     Comment: Procedure: COLONOSCOPY;  Surgeon: Rogene Houston, MD;  Location: [facility] ENDO SUITE;  Service:              Endoscopy;  Laterality: N/A;  7:30 03/11/2016: ESOPHAGOGASTRODUODENOSCOPY N/A     Comment: Procedure: ESOPHAGOGASTRODUODENOSCOPY (EGD);                Surgeon: Rogene Houston, MD;  Location: [facility]               ENDO SUITE;  Service: Endoscopy;  Laterality:               N/A;   Reproductive/Obstetrics                           Anesthesia Physical Anesthesia Plan  ASA:  III  Anesthesia Plan: Spinal   Post-op Pain Management:    Induction:   Airway Management Planned: Oral ETT  Additional Equipment:   Intra-op Plan:   Post-operative Plan:   Informed Consent: I have reviewed the patients History and Physical, chart, labs and discussed the procedure including the risks, benefits and alternatives for the proposed anesthesia with the patient or authorized representative who has indicated his/her understanding and acceptance.     Plan Discussed with: CRNA  Anesthesia Plan Comments:         Anesthesia Quick Evaluation

## 2016-08-09 NOTE — Care Management Important Message (Signed)
Important Message  Patient Details  Name: [name] MRN: [id number] Date of Birth: Feb 15, 1933   Medicare Important Message Given:  Yes    Jolly Mango, RN 08/09/2016, 9:52 AM

## 2016-08-09 NOTE — Progress Notes (Signed)
Initial Nutrition Assessment  DOCUMENTATION CODES:   Non-severe (moderate) malnutrition in context of chronic illness, Underweight  INTERVENTION:  Recommend Ensure Enlive po once daily, each supplement provides 350 kcal and 20 grams of protein.   Encouraged adequate intake of calories and protein to promote healing and prevent further weight loss.  Continue MVI daily.  NUTRITION DIAGNOSIS:   Malnutrition (Moderate) related to social / environmental circumstances (advanced age, limited ability/desire to prepare full meals) as evidenced by mild depletion of body fat, moderate depletion of body fat, mild depletion of muscle mass, moderate depletions of muscle mass.  GOAL:   Patient will meet greater than or equal to 90% of their needs  MONITOR:   PO intake, Supplement acceptance, Labs, Weight trends, I & O's  REASON FOR ASSESSMENT:   Other (Comment) (Low BMI)    ASSESSMENT:   83 year old female with PMHx of stroke, TIA, hypercholesterolemia, presents after mechanical fall found to have displaced left femoral neck fracture.   -Plan for hemiarthroplasty today.  Spoke with patient and family members at bedside. Patient initially reported she has a good appetite and eats pretty well. Family then reported that patient does not eat much at all. She reports eating 1-2 meals per day and occasionally has snacks if hungry. She provided very little detail on nutrition history. Only reports that she does not cook full meals regularly so will usually have something small like a sandwich for a meal. Reports she does take a MVI regularly at home. Amenable to drinking Ensure to help meet increased needs for healing after surgery.  Reports UBW 105 lbs. Patient has lost 5.7 lbs (5.4% body weight) over the past 5 months, which is not significant for time frame.  Medications reviewed and include: MVI daily.  Labs reviewed: Glucose 106.  Nutrition-Focused physical exam completed. Findings are  mild/moderate fat depletion, mild/moderate-severe muscle depletion, and no edema.   Discussed with RN.  Diet Order:  Diet NPO time specified Except for: Sips with Meds  Skin:  Reviewed, no issues  Last BM:  PTA (08/07/2016 per chart)  Height:   Ht Readings from Last 1 Encounters:  08/08/16 5\' 3"  (1.6 m)    Weight:   Wt Readings from Last 1 Encounters:  08/08/16 100 lb (45.4 kg)    Ideal Body Weight:  52.3 kg  BMI:  Body mass index is 17.71 kg/m.  Estimated Nutritional Needs:   Kcal:  1125-1350 (25-30 kcal/kg)  Protein:  55-65 grams (1.2-1.4 grams/kg)  Fluid:  1.1 L/day (25 ml/kg)  EDUCATION NEEDS:   Education needs addressed (Increased needs for kcal/protein to promote healing.)  Willey Blade, MS, RD, LDN Pager: [phone] After Hours Pager: [phone]

## 2016-08-10 NOTE — Progress Notes (Signed)
   Subjective: 1 Day Post-Op Procedure(s) (LRB): ARTHROPLASTY BIPOLAR HIP (HEMIARTHROPLASTY) (Left) Patient reports pain as 0 on 0-10 scale.   Patient is well, and has had no acute complaints or problems Denies any CP, SOB, ABD pain. We will continue therapy today.    Objective: Vital signs in last 24 hours: Temp:  [97.4 F (36.3 C)-99.1 F (37.3 C)] 99.1 F (37.3 C) (05/12 0800) Pulse Rate:  [75-90] 82 (05/12 0800) Resp:  [8-24] 16 (05/12 0800) BP: (111-168)/(47-77) 111/53 (05/12 0800) SpO2:  [88 %-100 %] 96 % (05/12 0800)  Intake/Output from previous day: 05/11 0701 - 05/12 0700 In: 2190 [P.O.:60; I.V.:1930; IV Piggyback:200] Out: 1080 [Urine:650; Drains:130; Blood:300] Intake/Output this shift: No intake/output data recorded.   Recent Labs  08/08/16 1514 08/09/16 0445  HGB 12.3 11.3*    Recent Labs  08/08/16 1514 08/09/16 0445  WBC 11.3* 11.2*  RBC 3.61* 3.41*  HCT 35.4 33.3*  PLT 268 224    Recent Labs  08/08/16 1514 08/09/16 0445  NA 140 139  K 3.6 3.9  CL 104 108  CO2 27 25  BUN 12 15  CREATININE 1.04* 0.77  GLUCOSE 117* 106*  CALCIUM 10.1 9.2    Recent Labs  08/08/16 1514  INR 1.04    EXAM General - Patient is Alert, Appropriate and Oriented Extremity - Neurovascular intact Sensation intact distally Intact pulses distally Dorsiflexion/Plantar flexion intact Incision: dressing C/D/I and no drainage No cellulitis present Compartment soft Dressing - dressing C/D/I and no drainage, hemovac intact Motor Function - intact, moving foot and toes well on exam.   Past Medical History:  Diagnosis Date  . Arthritis   . High cholesterol 02/29/2016  . Stroke ([place])   . TIA (transient ischemic attack) 02/29/2016    Assessment/Plan:   1 Day Post-Op Procedure(s) (LRB): ARTHROPLASTY BIPOLAR HIP (HEMIARTHROPLASTY) (Left) Active Problems:   Hip fracture ([facility])   Acute post op blood loss anemia   Estimated body mass index is 17.71 kg/m as  calculated from the following:   Height as of this encounter: 5\' 3"  (1.6 m).   Weight as of this encounter: 45.4 kg (100 lb). Advance diet Up with therapy  Needs BM CM to assist with discharge Acute post op blood loss anemia - Hgb 11.3, recheck labs in the am   DVT Prophylaxis - Lovenox, Foot Pumps and TED hose Weight-Bearing as tolerated to left leg   T. Rachelle Hora, PA-C [place] 08/10/2016, 9:41 AM

## 2016-08-10 NOTE — Evaluation (Signed)
Physical Therapy Evaluation Patient Details Name: [name] MRN: [id number] DOB: [birth year] Today's Date: 08/10/2016   History of Present Illness  Patient is a pleasant 83 y/o female that presents for L hip fx after moving a bench in her yard. She underwent L hip hemi-arthroplasty. She is typically very independent.   Clinical Impression  Patient fell in the yard sustaining L hip fx. She is typically quite independent, however she demonstrates LLE weakness and decreased tolerance for WBing in this session. She is able to come to the EOB with assistance, however to transfer sit to stand took assistance and prolonged time to complete. She is able to bear weight through LLE, though takes step to pattern of gait due to weakness/pain control. She is able to ambulate much further than anticipated though with her confusion and significant change from physical baseline, she would benefit from short term rehab when medically appropriate for discharge.     Follow Up Recommendations SNF    Equipment Recommendations  Rolling walker with 5" wheels    Recommendations for Other Services       Precautions / Restrictions Precautions Precautions: Fall Restrictions Weight Bearing Restrictions: Yes LLE Weight Bearing: Weight bearing as tolerated      Mobility  Bed Mobility Overal bed mobility: Needs Assistance Bed Mobility: Supine to Sit     Supine to sit: Min assist;Mod assist     General bed mobility comments: Patient requires assistance for trunk control to come to EOB.   Transfers Overall transfer level: Needs assistance Equipment used: Rolling walker (2 wheeled) Transfers: Sit to/from Stand Sit to Stand: Min assist;Mod assist         General transfer comment: Patient requires prolonged time and assistance to transfer from sit to stand with use of bilateral UEs.   Ambulation/Gait Ambulation/Gait assistance: Min guard Ambulation Distance (Feet): 65 Feet Assistive device:  Rolling walker (2 wheeled) Gait Pattern/deviations: Step-to pattern   Gait velocity interpretation: <1.8 ft/sec, indicative of risk for recurrent falls General Gait Details: Patient advances LLE first, she is able to perform step to pattern, no buckling noted. Very minimal increase in pain reported.   Stairs            Wheelchair Mobility    Modified Rankin (Stroke Patients Only)       Balance Overall balance assessment: Needs assistance Sitting-balance support: Bilateral upper extremity supported Sitting balance-Leahy Scale: Fair     Standing balance support: Bilateral upper extremity supported Standing balance-Leahy Scale: Fair                               Pertinent Vitals/Pain Pain Assessment:  (Patient is slightly confused throughout session but reports her pain levels are very tolerable. )    Home Living Family/patient expects to be discharged to:: Private residence Living Arrangements: Spouse/significant other Available Help at Discharge: Family Type of Home: House Home Access: Stairs to enter Entrance Stairs-Rails: None Entrance Stairs-Number of Steps: 3 Home Layout: One level;Laundry or work area in [employer]: None      Prior Function Level of Independence: Independent         Comments: Patient was moving table in the yard when she tripped and fell.      Hand Dominance        Extremity/Trunk Assessment   Upper Extremity Assessment Upper Extremity Assessment: Overall WFL for tasks assessed    Lower Extremity Assessment Lower Extremity Assessment: LLE deficits/detail  LLE Deficits / Details: Able to complete LAQs through most of the range, needs help with SLRs to initiate.        Communication   Communication: No difficulties  Cognition Arousal/Alertness: Awake/alert Behavior During Therapy: WFL for tasks assessed/performed Overall Cognitive Status: Impaired/Different from baseline (Patient is slightly confused  throughout session, confirmed by friend present. Likely related to anesthesia/medications. )                                        General Comments      Exercises General Exercises - Lower Extremity Ankle Circles/Pumps: AROM;15 reps;Both Long Arc Quad: AROM;Both;10 reps Heel Slides: AROM;Right;AAROM;Left;10 reps Hip ABduction/ADduction: AROM;Both;10 reps Straight Leg Raises: AROM;Right;AAROM;Left;10 reps   Assessment/Plan    PT Assessment Patient needs continued PT services  PT Problem List Decreased strength;Decreased mobility;Decreased range of motion;Decreased activity tolerance;Decreased knowledge of use of DME;Decreased balance;Pain       PT Treatment Interventions      PT Goals (Current goals can be found in the Care Plan section)  Acute Rehab PT Goals Patient Stated Goal: To return home  PT Goal Formulation: With patient Time For Goal Achievement: 08/24/16 Potential to Achieve Goals: Good    Frequency BID   Barriers to discharge Inaccessible home environment      Co-evaluation               AM-PAC PT "6 Clicks" Daily Activity  Outcome Measure Difficulty turning over in bed (including adjusting bedclothes, sheets and blankets)?: A Little Difficulty moving from lying on back to sitting on the side of the bed? : A Little Difficulty sitting down on and standing up from a chair with arms (e.g., wheelchair, bedside commode, etc,.)?: A Lot Help needed moving to and from a bed to chair (including a wheelchair)?: A Lot Help needed walking in hospital room?: A Lot Help needed climbing 3-5 steps with a railing? : Total 6 Click Score: 13    End of Session Equipment Utilized During Treatment: Gait belt Activity Tolerance: Patient tolerated treatment well Patient left: in chair;with chair alarm set;with call bell/phone within reach;with family/visitor present Nurse Communication: Mobility status PT Visit Diagnosis: Unsteadiness on feet  (R26.81);History of falling (Z91.81)    Time: 7356-7014 PT Time Calculation (min) (ACUTE ONLY): 24 min   Charges:   PT Evaluation $PT Eval Moderate Complexity: 1 Procedure PT Treatments $Therapeutic Exercise: 8-22 mins   PT G Codes:       Royce Macadamia PT, DPT, CSCS     08/10/2016, 12:54 PM

## 2016-08-10 NOTE — Progress Notes (Signed)
Physical Therapy Treatment Patient Details Name: [name] MRN: [id number] DOB: [birth year] Today's Date: 08/10/2016    History of Present Illness Patient is a pleasant 83 y/o female that presents for L hip fx after moving a bench in her yard. She underwent L hip hemi-arthroplasty. PMHx includes TIA/CVA, OA, HLD. She is typically very independent.     PT Comments    Pt was seen for bed exercises as she had just walked again for OT.  Her plan is to work BID until her dc to SNF, but is also very motivated to progress toward home as much as possible.  Follow acutely for LE strength, progression of handout exercises, standing balance and control of gait as is possible.   Follow Up Recommendations  SNF     Equipment Recommendations  Rolling walker with 5" wheels    Recommendations for Other Services       Precautions / Restrictions Precautions Precautions: Fall Restrictions Weight Bearing Restrictions: Yes LLE Weight Bearing: Weight bearing as tolerated    Mobility  Bed Mobility Overal bed mobility: Needs Assistance Bed Mobility: Sit to Supine     Supine to sit: Min guard;Min assist Sit to supine: Min guard;Min assist   General bed mobility comments: min guard for LLE mgt  Transfers Overall transfer level: Needs assistance Equipment used: Rolling walker (2 wheeled) Transfers: Sit to/from Stand Sit to Stand: Min assist         General transfer comment: cueing for hand placement, min assist for initial transfer out of recliner, once pt in bathroom, performed sit to stand toilet transfer on her own despite instruction not to from OT but with no LOB noted   Ambulation/Gait Ambulation/Gait assistance: Min guard Ambulation Distance (Feet): 65 Feet Assistive device: Rolling walker (2 wheeled) Gait Pattern/deviations: Step-to pattern   Gait velocity interpretation: <1.8 ft/sec, indicative of risk for recurrent falls General Gait Details: deferred as pt just walked  for OT   Stairs            Wheelchair Mobility    Modified Rankin (Stroke Patients Only)       Balance Overall balance assessment: Needs assistance;History of Falls Sitting-balance support: Bilateral upper extremity supported;Feet supported Sitting balance-Leahy Scale: Fair     Standing balance support: Bilateral upper extremity supported Standing balance-Leahy Scale: Fair                              Cognition Arousal/Alertness: Awake/alert Behavior During Therapy: WFL for tasks assessed/performed Overall Cognitive Status: No family/caregiver present to determine baseline cognitive functioning Area of Impairment: Following commands;Safety/judgement;Awareness;Problem solving                       Following Commands: Follows one step commands with increased time Safety/Judgement: Decreased awareness of deficits;Decreased awareness of safety Awareness: Intellectual Problem Solving: Decreased initiation;Difficulty sequencing;Requires verbal cues General Comments: mild confusion with repetitive instructions to manage      Exercises Total Joint Exercises Ankle Circles/Pumps: AROM;Both;5 reps Quad Sets: AROM;Both;10 reps Gluteal Sets: AROM;Both;10 reps Heel Slides: AROM;AAROM;Both;10 reps Hip ABduction/ADduction: AROM;AAROM;Both;10 reps General Exercises - Lower Extremity Ankle Circles/Pumps: AROM;15 reps;Both Long Arc Quad: AROM;Both;10 reps Heel Slides: AROM;Right;AAROM;Left;10 reps Hip ABduction/ADduction: AROM;Both;10 reps Straight Leg Raises: AROM;Right;AAROM;Left;10 reps Other Exercises Other Exercises: pt/family educated in falls prevention strategies including grab bar placement in bathroom and use of other AD/AE to minimize falls risk    General Comments  Pertinent Vitals/Pain Pain Assessment: 0-10 Pain Score: 6  Pain Location: L hip Pain Descriptors / Indicators: Aching Pain Intervention(s): Limited activity within  patient's tolerance;Monitored during session;Premedicated before session;Repositioned    Home Living Family/patient expects to be discharged to:: Private residence Living Arrangements: Spouse/significant other Available Help at Discharge: Family;Available 24 hours/day;Available PRN/intermittently (daughter lives next door, other family about 20 minutes away) Type of Home: House Home Access: Stairs to enter Entrance Stairs-Rails: None Home Layout: One level;Laundry or work area in basement Merchant navy officer and all other needs on first floor; keeps canned goods in basement) Home Equipment: Electronics engineer Comments: grandson has shower chair that he can set up in pt's shower    Prior Function Level of Independence: Independent      Comments: pt indep with ADL, driving, household tasks, and med mgt at baseline; endorses 1 fall in past 12 months that led to current admission   PT Goals (current goals can now be found in the care plan section) Acute Rehab PT Goals Patient Stated Goal: get better PT Goal Formulation: With patient Time For Goal Achievement: 08/24/16 Potential to Achieve Goals: Good Progress towards PT goals: Progressing toward goals    Frequency    BID      PT Plan Current plan remains appropriate    Co-evaluation              AM-PAC PT "6 Clicks" Daily Activity  Outcome Measure  Difficulty turning over in bed (including adjusting bedclothes, sheets and blankets)?: Total Difficulty moving from lying on back to sitting on the side of the bed? : Total Difficulty sitting down on and standing up from a chair with arms (e.g., wheelchair, bedside commode, etc,.)?: Total Help needed moving to and from a bed to chair (including a wheelchair)?: A Lot Help needed walking in hospital room?: A Lot Help needed climbing 3-5 steps with a railing? : Total 6 Click Score: 8    End of Session Equipment Utilized During Treatment: Gait belt Activity Tolerance: Patient  tolerated treatment well Patient left: in bed;with call bell/phone within reach;with bed alarm set Nurse Communication: Mobility status PT Visit Diagnosis: Unsteadiness on feet (R26.81);History of falling (Z91.81)     Time: 9702-6378 PT Time Calculation (min) (ACUTE ONLY): 34 min  Charges:  $Therapeutic Exercise: 8-22 mins $Therapeutic Activity: 8-22 mins                    G Codes:        Ramond Dial Aug 16, 2016, 2:25 PM   Mee Hives, PT MS Acute Rehab Dept. Number: [facility]

## 2016-08-10 NOTE — Evaluation (Signed)
Occupational Therapy Evaluation Patient Details Name: [name] MRN: [id number] DOB: 12/25/1932 Today's Date: 08/10/2016    History of Present Illness (P) Patient is a pleasant 83 y/o female that presents for L hip fx after moving a bench in her yard. She underwent L hip hemi-arthroplasty. PMHx includes TIA/CVA, OA, HLD. She is typically very independent.    Clinical Impression   Pt seen for OT evaluation this date. Pt was independent prior to fall that led to hip fracture and subsequent surgical repair of her L hip. Pt mildly confused/decreased safety awareness during session. Possibly due to medications/anesthesia. Pt presents with pain in L hip, decreased safety with functional mobility and self care tasks requiring verbal cues for hand placement during transfers and min assist for transfers, and clothing mgt during toileting tasks. Pt will benefit from skilled OT services to address noted impairments (see problems list below) and functional deficits in order to maximize return to PLOF and minimize future falls risk, injury, and rehospitalization.    Follow Up Recommendations  SNF    Equipment Recommendations  3 in 1 bedside commode;Other (comment) (grab bars to be installed in bathroom)    Recommendations for Other Services       Precautions / Restrictions Precautions Precautions: (P) Fall Restrictions Weight Bearing Restrictions: (P) Yes LLE Weight Bearing: (P) Weight bearing as tolerated      Mobility Bed Mobility Overal bed mobility: (P) Needs Assistance Bed Mobility: (P) Sit to Supine     Supine to sit: (P) Min guard;Min assist Sit to supine: (P) Min guard;Min assist   General bed mobility comments: min guard for LLE mgt  Transfers Overall transfer level: (P) Needs assistance Equipment used: (P) Rolling walker (2 wheeled) Transfers: (P) Sit to/from Stand Sit to Stand: (P) Min assist         General transfer comment: cueing for hand placement, min assist  for initial transfer out of recliner, once pt in bathroom, performed sit to stand toilet transfer on her own despite instruction not to from OT but with no LOB noted     Balance Overall balance assessment: Needs assistance;History of Falls Sitting-balance support: Bilateral upper extremity supported;Feet supported Sitting balance-Leahy Scale: Fair     Standing balance support: Bilateral upper extremity supported Standing balance-Leahy Scale: Fair                             ADL either performed or assessed with clinical judgement   ADL Overall ADL's : Needs assistance/impaired Eating/Feeding: Sitting;Set up   Grooming: Standing;Wash/dry hands;Min guard   Upper Body Bathing: Sitting;Supervision/ safety;Set up   Lower Body Bathing: Sitting/lateral leans;Minimal assistance;Set up   Upper Body Dressing : Set up;Sitting;Supervision/safety   Lower Body Dressing: Minimal assistance;Sit to/from stand;Sitting/lateral leans;Cueing for safety;Cueing for compensatory techniques   Toilet Transfer: Min guard;Minimal assistance;Cueing for safety;Comfort height toilet;Ambulation;RW Toilet Transfer Details (indicate cue type and reason): verbal cues for hand placement to maximize safety to [facility] over toilet Toileting- Clothing Manipulation and Hygiene: Minimal assistance;Sitting/lateral lean;Sit to/from stand;Independent Toileting - Clothing Manipulation Details (indicate cue type and reason): min assist to manage disposable tab style brief, pt able to perform hygiene independently     Functional mobility during ADLs: Min guard;Rolling walker General ADL Comments: pt generally supervision to min assist level for LB ADL, cueing for safety     Vision Baseline Vision/History: Wears glasses Wears Glasses: At all times Patient Visual Report: No change from baseline Vision Assessment?:  No apparent visual deficits     Perception     Praxis      Pertinent Vitals/Pain Pain Assessment:  (P) 0-10 Pain Score: (P) 6  Pain Location: (P) L hip Pain Descriptors / Indicators: (P) Aching Pain Intervention(s): (P) Limited activity within patient's tolerance;Monitored during session;Premedicated before session;Repositioned     Hand Dominance     Extremity/Trunk Assessment Upper Extremity Assessment Upper Extremity Assessment: Overall WFL for tasks assessed   Lower Extremity Assessment Lower Extremity Assessment: Defer to PT evaluation;LLE deficits/detail LLE Deficits / Details: Able to complete LAQs through most of the range, needs help with SLRs to initiate.        Communication Communication Communication: HOH (pt endorses slight hearing loss in L>R ear)   Cognition Arousal/Alertness: (P) Awake/alert Behavior During Therapy: (P) WFL for tasks assessed/performed Overall Cognitive Status: (P) No family/caregiver present to determine baseline cognitive functioning Area of Impairment: (P) Following commands;Safety/judgement;Awareness;Problem solving                       Following Commands: (P) Follows one step commands with increased time Safety/Judgement: (P) Decreased awareness of deficits;Decreased awareness of safety Awareness: (P) Intellectual Problem Solving: (P) Decreased initiation;Difficulty sequencing;Requires verbal cues General Comments: (P) mild confusion with repetitive instructions to manage   General Comments       Exercises  Other Exercises Other Exercises: pt/family educated in falls prevention strategies including grab bar placement in bathroom and use of other AD/AE to minimize falls risk   Shoulder Instructions      Home Living Family/patient expects to be discharged to:: Private residence Living Arrangements: Spouse/significant other Available Help at Discharge: Family;Available 24 hours/day;Available PRN/intermittently (daughter lives next door, other family about 20 minutes away) Type of Home: House Home Access: Stairs to  enter [employer] of Steps: 3 Entrance Stairs-Rails: None Home Layout: One level;Laundry or work area in basement Merchant navy officer and all other needs on first floor; keeps canned goods in basement)     Bathroom Shower/Tub: Walk-in Hydrologist: Programmer, systems:  (unsure, grandson concerned doorways are too small)   Home Equipment: Civil engineer, contracting   Additional Comments: grandson has shower chair that he can set up in pt's shower      Prior Functioning/Environment Level of Independence: Independent        Comments: pt indep with ADL, driving, household tasks, and med mgt at baseline; endorses 1 fall in past 12 months that led to current admission        OT Problem List: Pain;Decreased cognition;Decreased safety awareness;Decreased activity tolerance;Decreased range of motion;Impaired UE functional use;Decreased knowledge of precautions;Decreased knowledge of use of DME or AE;Impaired balance (sitting and/or standing)      OT Treatment/Interventions: Self-care/ADL training;Therapeutic exercise;Therapeutic activities;DME and/or AE instruction;Energy conservation;Patient/family education    OT Goals(Current goals can be found in the care plan section) Acute Rehab OT Goals Patient Stated Goal: get better OT Goal Formulation: With patient Time For Goal Achievement: 08/24/16 Potential to Achieve Goals: Good  OT Frequency: Min 1X/week   Barriers to D/C: Inaccessible home environment;Decreased caregiver support  spouse HOH, unable to provide much physical assist       Co-evaluation              AM-PAC PT "6 Clicks" Daily Activity     Outcome Measure Help from another person eating meals?: None Help from another person taking care of personal grooming?: A Little Help from another person toileting, which includes  using toliet, bedpan, or urinal?: A Little Help from another person bathing (including washing, rinsing, drying)?: A  Little Help from another person to put on and taking off regular upper body clothing?: A Little Help from another person to put on and taking off regular lower body clothing?: A Little 6 Click Score: 19   End of Session Equipment Utilized During Treatment: Gait belt;Rolling walker  Activity Tolerance: Patient tolerated treatment well Patient left: in bed;with call bell/phone within reach;Other (comment) (lying in bed with PT in room for therapy session)  OT Visit Diagnosis: Other abnormalities of gait and mobility (R26.89);History of falling (Z91.81);Pain;Other symptoms and signs involving cognitive function Pain - Right/Left: Left Pain - part of body: Hip                Time: 2703-5009 OT Time Calculation (min): 35 min Charges:  OT General Charges $OT Visit: 1 Procedure OT Evaluation $OT Eval Low Complexity: 1 Procedure OT Treatments $Self Care/Home Management : 23-37 mins G-Codes:     Jeni Salles, MPH, MS, OTR/L ascom [phone] 08/10/16, 2:34 PM

## 2016-08-10 NOTE — Progress Notes (Signed)
[name] at [place] NAME: [name]    MR#:  [id number]  DATE OF BIRTH:  December 03, 1932  SUBJECTIVE:   Patient came in after she had a mechanical fall while trying to heavy chest of drawers with her husband at home. POD 1 No new complaints REVIEW OF SYSTEMS:   Review of Systems  Constitutional: Negative for chills, fever and weight loss.  HENT: Negative for ear discharge, ear pain and nosebleeds.   Eyes: Negative for blurred vision, pain and discharge.  Respiratory: Negative for sputum production, shortness of breath, wheezing and stridor.   Cardiovascular: Negative for chest pain, palpitations, orthopnea and PND.  Gastrointestinal: Negative for abdominal pain, diarrhea, nausea and vomiting.  Genitourinary: Negative for frequency and urgency.  Musculoskeletal: Positive for joint pain. Negative for back pain.  Neurological: Positive for weakness. Negative for sensory change, speech change and focal weakness.  Psychiatric/Behavioral: Negative for depression and hallucinations. The patient is not nervous/anxious.    Tolerating Diet:yes Tolerating PT: snf  DRUG ALLERGIES:  No Known Allergies  VITALS:  Blood pressure (!) 111/53, pulse 82, temperature 99.1 F (37.3 C), temperature source Oral, resp. rate 16, height 5\' 3"  (1.6 m), weight 45.4 kg (100 lb), SpO2 96 %.  PHYSICAL EXAMINATION:   Physical Exam  GENERAL:  83 y.o.-year-old patient lying in the bed with no acute distress.  EYES: Pupils equal, round, reactive to light and accommodation. No scleral icterus. Extraocular muscles intact.  HEENT: Head atraumatic, normocephalic. Oropharynx and nasopharynx clear.  NECK:  Supple, no jugular venous distention. No thyroid enlargement, no tenderness.  LUNGS: Normal breath sounds bilaterally, no wheezing, rales, rhonchi. No use of accessory muscles of respiration.  CARDIOVASCULAR: S1, S2 normal. No murmurs, rubs, or gallops.  ABDOMEN:  Soft, nontender, nondistended. Bowel sounds present. No organomegaly or mass.  EXTREMITIES: No cyanosis, clubbing or edema b/l.   Left LE surgical dressing+ NEUROLOGIC: Cranial nerves II through XII are intact. No focal Motor or sensory deficits b/l.   PSYCHIATRIC:  patient is alert and oriented x 3.  SKIN: No obvious rash, lesion, or ulcer.   LABORATORY PANEL:  CBC  Recent Labs Lab 08/09/16 0445  WBC 11.2*  HGB 11.3*  HCT 33.3*  PLT 224    Chemistries   Recent Labs Lab 08/09/16 0445  NA 139  K 3.9  CL 108  CO2 25  GLUCOSE 106*  BUN 15  CREATININE 0.77  CALCIUM 9.2   Cardiac Enzymes No results for input(s): TROPONINI in the last 168 hours. RADIOLOGY:  Ct Head Wo Contrast  Result Date: 08/08/2016 CLINICAL DATA:  83 year old female with a history of fall EXAM: CT HEAD WITHOUT CONTRAST TECHNIQUE: Contiguous axial images were obtained from the base of the skull through the vertex without intravenous contrast. COMPARISON:  MR head 10/07/2008 FINDINGS: Brain: No acute intracranial hemorrhage. No midline shift or mass effect. Diffuse brain volume loss. Periventricular white matter hypodensity. Unremarkable configuration the ventricles. Vascular: Calcifications of the intracranial vasculature. Skull: No aggressive bone lesion.  No fracture. Sinuses/Orbits: Unremarkable Other: None IMPRESSION: No CT evidence of acute intracranial abnormality. Evidence of chronic microvascular ischemic disease with associated intracranial atherosclerosis. Electronically Signed   By: Corrie Mckusick D.O.   On: 08/08/2016 15:08   Dg Chest Portable 1 View  Result Date: 08/08/2016 CLINICAL DATA:  Preoperative evaluation EXAM: PORTABLE CHEST 1 VIEW COMPARISON:  02/10/2014 FINDINGS: The heart size and mediastinal contours are within normal limits. Both lungs are clear. The  visualized skeletal structures are unremarkable. IMPRESSION: No active disease. Electronically Signed   By: Inez Catalina M.D.   On:  08/08/2016 15:01   Dg Hip Unilat With Pelvis 2-3 Views Left  Result Date: 08/09/2016 CLINICAL DATA:  Status post hip hemiarthroplasty. EXAM: DG HIP (WITH OR WITHOUT PELVIS) 2-3V LEFT COMPARISON:  Preoperative radiographs yesterday. FINDINGS: Left hip hemiarthroplasty in expected alignment. Femoral stem is midline. Recent postsurgical change includes surgical drain and lateral skin staples. No new abnormality. IMPRESSION: Left hip hemiarthroplasty without immediate postprocedural complication. Electronically Signed   By: Jeb Levering M.D.   On: 08/09/2016 21:59   Dg Hip Unilat W Or Wo Pelvis 2-3 Views Left  Result Date: 08/08/2016 CLINICAL DATA:  Pain after fall. EXAM: DG HIP (WITH OR WITHOUT PELVIS) 2-3V LEFT COMPARISON:  None. FINDINGS: There is a nondisplaced fracture through the left femoral neck without dislocation. IMPRESSION: Nondisplaced fracture through the left femoral neck. Electronically Signed   By: Dorise Bullion III M.D   On: 08/08/2016 15:16   ASSESSMENT AND PLAN:  [name]  is a 83 y.o. female with a known history of  Osteoarthritis, cva, hyperlipidemia is here with fall. Patient presented to the emergency room with these symptoms and is noted to have a hip fracture.   1. Left femoral neck fracture -pt see by Dr Marry Guan. inpyt appreciated -POD #1 Left THA  2. h/o cva - hold aggenox  3. Hyperlipidemia continue simvastatin  PT recommends rehab CSW for d/c planning  D/w pt and family  Case discussed with Care Management/Social Worker. Management plans discussed with the patient, family and they are in agreement.  CODE STATUS: FULL  DVT Prophylaxis: lovenox post op  TOTAL TIME TAKING CARE OF THIS PATIENT: *30* minutes.  >50% time spent on counselling and coordination of care  POSSIBLE D/C IN 2-3 DAYS, DEPENDING ON CLINICAL CONDITION.  Note: This dictation was prepared with Dragon dictation along with smaller phrase technology. Any transcriptional errors  that result from this process are unintentional.  [name] M.D on 08/10/2016 at 1:05 PM  Between 7am to 6pm - Pager - [phone]  After 6pm go to [email or web address] - password [email or web address] [facility]  Office  [phone]  CC: Primary care physician; Elaine Sites, MD

## 2016-08-10 NOTE — Progress Notes (Signed)
Patient arrived from surgery.  Drowsy and mildly confused with mild complaint of nausea.  PRN zofran administered per order.  Denies pain at this time and shows no signs of distress.  Surgical incision is clean dry and intact.

## 2016-08-10 NOTE — Clinical Social Work Placement (Signed)
   CLINICAL SOCIAL WORK PLACEMENT  NOTE  Date:  08/10/2016  Patient Details  Name: [name] MRN: [id number] Date of Birth: 06-19-1932  Clinical Social Work is seeking post-discharge placement for this patient at the [place] level of care (*CSW will initial, date and re-position this form in  chart as items are completed):  Yes   Patient/family provided with [facility] Work Department's list of facilities offering this level of care within the geographic area requested by the patient (or if unable, by the patient's family).  Yes   Patient/family informed of their freedom to choose among providers that offer the needed level of care, that participate in Medicare, Medicaid or managed care program needed by the patient, have an available bed and are willing to accept the patient.  Yes   Patient/family informed of [facility]'s ownership interest in [facility] and [facility], as well as of the fact that they are under no obligation to receive care at these facilities.  PASRR submitted to EDS on 08/10/16     PASRR number received on 08/10/16     Existing PASRR number confirmed on       FL2 transmitted to all facilities in geographic area requested by pt/family on 08/10/16     FL2 transmitted to all facilities within larger geographic area on       Patient informed that his/her managed care company has contracts with or will negotiate with certain facilities, including the following:            Patient/family informed of bed offers received.  Patient chooses bed at       Physician recommends and patient chooses bed at      Patient to be transferred to   on  .  Patient to be transferred to facility by       Patient family notified on   of transfer.  Name of family member notified:        PHYSICIAN       Additional Comment:    _______________________________________________ Zettie Pho, LCSW 08/10/2016, 2:42 PM

## 2016-08-10 NOTE — NC FL2 (Signed)
[place] LEVEL OF CARE SCREENING TOOL     IDENTIFICATION  Patient Name: [name] Birthdate: 09/01/1932 Sex: female Admission Date (Current Location): 08/08/2016  [place] and [place] Number:  Engineering geologist and Address:  [facility], [address] [place], [place] [address]      Provider Number: [id number]  Attending Physician Name and Address:  Fritzi Mandes, MD  Relative Name and Phone Number:       Current Level of Care: Hospital Recommended Level of Care: [place] Prior Approval Number:    Date Approved/Denied:   PASRR Number: [id number]  Discharge Plan: SNF    Current Diagnoses: Patient Active Problem List   Diagnosis Date Noted  . Hip fracture ([place]) 08/08/2016  . Melena 03/05/2016  . Absolute anemia 03/05/2016  . TIA (transient ischemic attack) 02/29/2016  . High cholesterol 02/29/2016    Orientation RESPIRATION BLADDER Height & Weight     Self, Time, Situation, Place  Normal Continent Weight: 100 lb (45.4 kg) Height:  5\' 3"  (160 cm)  BEHAVIORAL SYMPTOMS/MOOD NEUROLOGICAL BOWEL NUTRITION STATUS      Continent    AMBULATORY STATUS COMMUNICATION OF NEEDS Skin   Extensive Assist Verbally Surgical wounds                       Personal Care Assistance Level of Assistance  Bathing, Feeding, Dressing Bathing Assistance: Limited assistance Feeding assistance: Independent Dressing Assistance: Limited assistance     Functional Limitations Info             SPECIAL CARE FACTORS FREQUENCY  PT (By licensed PT)     PT Frequency: Up to 5X per day, 5 days per week              Contractures      Additional Factors Info                  Current Medications (08/10/2016):  This is the current hospital active medication list Current Facility-Administered Medications  Medication Dose Route Frequency Provider Last Rate Last Dose  . 0.9 %  sodium chloride infusion   Intravenous  Continuous Hooten, Laurice Record, MD 100 mL/hr at 08/10/16 1032    . acetaminophen (OFIRMEV) IV 1,000 mg  1,000 mg Intravenous Q6H Hooten, Laurice Record, MD 400 mL/hr at 08/10/16 1357 1,000 mg at 08/10/16 1357  . acetaminophen (TYLENOL) tablet 650 mg  650 mg Oral Q6H PRN Hooten, Laurice Record, MD       Or  . acetaminophen (TYLENOL) suppository 650 mg  650 mg Rectal Q6H PRN Hooten, Laurice Record, MD      . bisacodyl (DULCOLAX) suppository 10 mg  10 mg Rectal Daily PRN Hooten, Laurice Record, MD      . ceFAZolin (ANCEF) 2 g in dextrose 5 % 100 mL IVPB  2 g Intravenous Q6H Hooten, Laurice Record, MD   Stopped at 08/10/16 1058  . enoxaparin (LOVENOX) injection 40 mg  40 mg Subcutaneous Q24H Hooten, Laurice Record, MD   40 mg at 08/10/16 0845  . feeding supplement (ENSURE ENLIVE) (ENSURE ENLIVE) liquid 237 mL  237 mL Oral Q24H Fritzi Mandes, MD   Stopped at 08/10/16 [phone]  . ferrous sulfate tablet 325 mg  325 mg Oral BID WC Hooten, Laurice Record, MD   325 mg at 08/10/16 0846  . magnesium hydroxide (MILK OF MAGNESIA) suspension 30 mL  30 mL Oral Daily PRN Hooten, Laurice Record, MD      .  menthol-cetylpyridinium (CEPACOL) lozenge 3 mg  1 lozenge Oral PRN Hooten, Laurice Record, MD       Or  . phenol (CHLORASEPTIC) mouth spray 1 spray  1 spray Mouth/Throat PRN Hooten, Laurice Record, MD      . metoCLOPramide (REGLAN) tablet 5-10 mg  5-10 mg Oral Q8H PRN Hooten, Laurice Record, MD       Or  . metoCLOPramide (REGLAN) injection 5-10 mg  5-10 mg Intravenous Q8H PRN Hooten, Laurice Record, MD      . metoCLOPramide (REGLAN) tablet 10 mg  10 mg Oral TID AC & HS Hooten, Laurice Record, MD   10 mg at 08/10/16 1358  . morphine 2 MG/ML injection 2-4 mg  2-4 mg Intravenous Q4H PRN Hooten, Laurice Record, MD      . multivitamin with minerals tablet 1 tablet  1 tablet Oral Daily Dustin Flock, MD   1 tablet at 08/10/16 0845  . ondansetron (ZOFRAN) tablet 4 mg  4 mg Oral Q6H PRN Hooten, Laurice Record, MD       Or  . ondansetron (ZOFRAN) injection 4 mg  4 mg Intravenous Q6H PRN Hooten, Laurice Record, MD   4 mg at 08/09/16 2254   . oxyCODONE (Oxy IR/ROXICODONE) immediate release tablet 5-10 mg  5-10 mg Oral Q4H PRN Hooten, Laurice Record, MD      . pantoprazole (PROTONIX) EC tablet 40 mg  40 mg Oral BID Dereck Leep, MD   40 mg at 08/10/16 0846  . senna-docusate (Senokot-S) tablet 1 tablet  1 tablet Oral BID Dereck Leep, MD   1 tablet at 08/10/16 0846  . simvastatin (ZOCOR) tablet 20 mg  20 mg Oral q morning - 10a Dustin Flock, MD   20 mg at 08/10/16 0846  . sodium phosphate (FLEET) 7-19 GM/118ML enema 1 enema  1 enema Rectal Once PRN Hooten, Laurice Record, MD      . traMADol Veatrice Bourbon) tablet 50-100 mg  50-100 mg Oral Q4H PRN Hooten, Laurice Record, MD         Discharge Medications: Please see discharge summary for a list of discharge medications.  Relevant Imaging Results:  Relevant Lab Results:   Additional Information SS#[id number]  Zettie Pho, LCSW

## 2016-08-10 NOTE — Clinical Social Work Note (Signed)
Clinical Social Work Assessment  Patient Details  Name: [name] MRN: [id number] Date of Birth: 1933/02/01  Date of referral:  08/10/16               Reason for consult:  Facility Placement                Permission sought to share information with:  Chartered certified accountant granted to share information::  Yes, Verbal Permission Granted  Name::        Agency::     Relationship::     Contact Information:     Housing/Transportation Living arrangements for the past 2 months:  Single Family Home Source of Information:  Patient, Adult Children, Spouse, Medical Team Patient Interpreter Needed:  None Criminal Activity/Legal Involvement Pertinent to Current Situation/Hospitalization:  No - Comment as needed Significant Relationships:  Adult Children, Church, [employer], Spouse Lives with:  Spouse Do you feel safe going back to the place where you live?  Yes Need for family participation in patient care:  No (Coment)  Care giving concerns:  PT recommendation for STR    Social Worker assessment / plan:  The CSW met with the patient, her daughter [name], and the patient's spouse Kerry Dory at bedside to discuss discharge planning. The patient and her family gave verbal permission to conduct a referral, and they named [facility] as the preference with [facility] as the second choice. The family does not want [facility] in [place].   At baseline, the patient is independent with all ADLs and IADLs. This episode will be her first experience with STR. The patient has a supportive family.  Discharge will most likely be on Monday, and the CSW will follow up with bed offers when available.  Employment status:  Retired Nurse, adult PT Recommendations:  [place] / Referral to community resources:  [place]  Patient/Family's Response to care:  The patient and her family thanked the CSW for  assistance.  Patient/Family's Understanding of and Emotional Response to Diagnosis, Current Treatment, and Prognosis:  The patient understands the PT recommendation and is in agreement with it.  Emotional Assessment Appearance:  Appears younger than stated age Attitude/Demeanor/Rapport:   (Pleasant) Affect (typically observed):  Appropriate, Pleasant, Accepting Orientation:  Oriented to Self, Oriented to Place, Oriented to  Time, Oriented to Situation Alcohol / Substance use:  Never Used Psych involvement (Current and /or in the community):  No (Comment)  Discharge Needs  Concerns to be addressed:  Care Coordination, Discharge Planning Concerns Readmission within the last 30 days:  No Current discharge risk:  None Barriers to Discharge:  Continued Medical Work up   [employer], LCSW 08/10/2016, 2:37 PM

## 2016-08-11 LAB — HEMOGLOBIN: HEMOGLOBIN: 9.8 g/dL — AB (ref 12.0–16.0)

## 2016-08-11 MED ORDER — TRAMADOL HCL 50 MG PO TABS: 50.0000 mg | ORAL_TABLET | Freq: Three times a day (TID) | ORAL | 0 refills | Status: AC | PRN

## 2016-08-11 MED ORDER — ENOXAPARIN SODIUM 40 MG/0.4ML ~~LOC~~ SOLN: 40.0000 mg | SUBCUTANEOUS | 0 refills | Status: DC

## 2016-08-11 MED ORDER — FERROUS SULFATE 325 (65 FE) MG PO TABS: 325.0000 mg | ORAL_TABLET | Freq: Two times a day (BID) | ORAL | 3 refills | Status: DC

## 2016-08-11 NOTE — Plan of Care (Signed)
Problem: Skin Integrity: Goal: Demonstration of wound healing without infection will improve Outcome: Completed/Met Date Met: 08/11/16 Pt has met goals for discharge.

## 2016-08-11 NOTE — Progress Notes (Signed)
Physical Therapy Treatment Patient Details Name: [name] MRN: [id number] DOB: May 21, 1932 Today's Date: 08/11/2016    History of Present Illness Patient is a pleasant 83 y/o female that presents for L hip fx after moving a bench in her yard. She underwent L hip hemi-arthroplasty. PMHx includes TIA/CVA, OA, HLD. She is typically very independent.     PT Comments     Patient demonstrates significant improvement in weightbearing tolerance on LLE this date, with reduction in antalgic pattern noted in evaluation yesterday. She is able to ambulate ~125' with RW and no loss of balance at improved speed from previous PT session(s) and is able to ascend/descend steps without physical assistance with bilateral HHA. She is still impulsive and slightly confused and would need 24 hour supervision. A bedside commode would be beneficial if she continues to have frequency of voiding she has been having to reduce frequent demand of transfers/ambulation. Per patient her husband will be home and it would be possible for her daughter to stay with her 24/7. Patient appears to have the physical capacity to manage at home, will need constant supervision as she is impulsive and at high falls risk.  Follow Up Recommendations  Home health PT;Supervision/Assistance - 24 hour     Equipment Recommendations  Rolling walker with 5" wheels;3in1 (PT)    Recommendations for Other Services       Precautions / Restrictions Precautions Precautions: Fall Restrictions Weight Bearing Restrictions: Yes LLE Weight Bearing: Weight bearing as tolerated    Mobility  Bed Mobility Overal bed mobility: Needs Assistance             General bed mobility comments: Deferred as patient was in bedside recliner.   Transfers Overall transfer level: Needs assistance Equipment used: Rolling walker (2 wheeled) Transfers: Sit to/from Stand Sit to Stand: Min guard         General transfer comment: Patient is able to stand  multiple times in session with use of UEs and RW, on several occasions the transfer is swift, others requires trunkal flexion and prolonged time to complete. No loss of balance demonstrated.   Ambulation/Gait Ambulation/Gait assistance: Min guard Ambulation Distance (Feet): 125 Feet Assistive device: Rolling walker (2 wheeled) Gait Pattern/deviations: Step-to pattern   Gait velocity interpretation: <1.8 ft/sec, indicative of risk for recurrent falls General Gait Details: Patient demonstrating improved WBing tolerance through LLE, less antalgic pattern than yesterday with improved gait speed. No buckling noted.    Stairs Stairs: Yes   Stair Management: Two rails;Step to pattern Number of Stairs: 4 General stair comments: Patient is able to perform step to pattern ascending and descending steps with bilateral HHA. No loss of balance or buckling.   Wheelchair Mobility    Modified Rankin (Stroke Patients Only)       Balance Overall balance assessment: Needs assistance;History of Falls Sitting-balance support: Bilateral upper extremity supported;Feet supported Sitting balance-Leahy Scale: Good     Standing balance support: Bilateral upper extremity supported Standing balance-Leahy Scale: Fair                              Cognition Arousal/Alertness: Awake/alert Behavior During Therapy: WFL for tasks assessed/performed Overall Cognitive Status: Impaired/Different from baseline (Patient does not remember this author from yesterday. She appears pleasantly confused. )  Exercises Other Exercises Other Exercises: Assisted patient to use of commode, cga required for transfer from sit <--> stand with use of RW.     General Comments        Pertinent Vitals/Pain Pain Assessment: Faces Faces Pain Scale: Hurts little more Pain Location: L hip Pain Descriptors / Indicators: Aching Pain Intervention(s): Limited  activity within patient's tolerance;Monitored during session;Repositioned    Home Living                      Prior Function            PT Goals (current goals can now be found in the care plan section) Acute Rehab PT Goals Patient Stated Goal: get better Time For Goal Achievement: 08/24/16 Potential to Achieve Goals: Good Progress towards PT goals: Progressing toward goals    Frequency    BID      PT Plan Discharge plan needs to be updated    Co-evaluation              AM-PAC PT "6 Clicks" Daily Activity  Outcome Measure  Difficulty turning over in bed (including adjusting bedclothes, sheets and blankets)?: A Little Difficulty moving from lying on back to sitting on the side of the bed? : A Lot Difficulty sitting down on and standing up from a chair with arms (e.g., wheelchair, bedside commode, etc,.)?: A Little Help needed moving to and from a bed to chair (including a wheelchair)?: None Help needed walking in hospital room?: None Help needed climbing 3-5 steps with a railing? : A Little 6 Click Score: 19    End of Session Equipment Utilized During Treatment: Gait belt Activity Tolerance: Patient tolerated treatment well Patient left: in chair;with chair alarm set;with call bell/phone within reach Nurse Communication: Mobility status PT Visit Diagnosis: Unsteadiness on feet (R26.81);History of falling (Z91.81)     Time: 1000-1025 PT Time Calculation (min) (ACUTE ONLY): 25 min  Charges:  $Gait Training: 23-37 mins                    G Codes:       Royce Macadamia PT, DPT, CSCS    08/11/2016, 11:29 AM

## 2016-08-11 NOTE — Discharge Summary (Signed)
[place] [facility] [place] NAME: [name]    MR#:  [id number]  DATE OF BIRTH:  [birth year]  DATE OF ADMISSION:  08/08/2016 ADMITTING PHYSICIAN: Dustin Flock, MD  DATE OF DISCHARGE: 08/11/16  PRIMARY CARE PHYSICIAN: [name], MD    ADMISSION DIAGNOSIS:  Closed fracture of left hip, initial encounter ([place]) [S72.002A]  DISCHARGE DIAGNOSIS:  Left femoral neck fracture s/p THA, left SECONDARY DIAGNOSIS:   Past Medical History:  Diagnosis Date  . Arthritis   . High cholesterol 02/29/2016  . Stroke ([place])   . TIA (transient ischemic attack) 02/29/2016    HOSPITAL COURSE:   PeggyPaschalis a 83 y.o.femalewith a known history of Osteoarthritis, cva, hyperlipidemia is here with fall. Patient presented to the emergency room with these symptoms and is noted to have a hip fracture.   1. Left femoral neck fracture -pt see by Dr Marry Guan. inpyt appreciated -POD #2 Left THA -Doing very well with physical therapy. She did step spell as well. Family is able to provide 24 7 care. This was discussed with family. -Home health PT will be arranged -Subcutaneous Lovenox for 14 days for DVT prophylaxis  2. h/o cva - resume aggenox  3. Hyperlipidemia continue simvastatin  Care management for discharge planning D/w pt and family Discharge home CONSULTS OBTAINED:  Treatment Team:  Dereck Leep, MD  DRUG ALLERGIES:  No Known Allergies  DISCHARGE MEDICATIONS:   Current Discharge Medication List    START taking these medications   Details  enoxaparin (LOVENOX) 40 MG/0.4ML injection Inject 0.4 mLs (40 mg total) into the skin daily. Qty: 14 Syringe, Refills: 0    ferrous sulfate 325 (65 FE) MG tablet Take 1 tablet (325 mg total) by mouth 2 (two) times daily with a meal. Qty: 60 tablet, Refills: 3    traMADol (ULTRAM) 50 MG tablet Take 1 tablet (50 mg total) by mouth every 8 (eight) hours as needed for moderate  pain. Qty: 10 tablet, Refills: 0      CONTINUE these medications which have NOT CHANGED   Details  dipyridamole-aspirin (AGGRENOX) 200-25 MG 12hr capsule Take 1 capsule by mouth 2 (two) times daily. Qty: 180 capsule, Refills: 1    Multiple Vitamin (MULTIVITAMIN WITH MINERALS) TABS tablet Take 1 tablet by mouth daily.    simvastatin (ZOCOR) 20 MG tablet Take 20 mg by mouth every morning.    bismuth-metronidazole-tetracycline (PYLERA) 140-125-125 MG capsule Take 3 capsules by mouth 4 (four) times daily -  before meals and at bedtime. Qty: 120 capsule, Refills: 0        If you experience worsening of your admission symptoms, develop shortness of breath, life threatening emergency, suicidal or homicidal thoughts you must seek medical attention immediately by calling 911 or calling your MD immediately  if symptoms less severe.  You Must read complete instructions/literature along with all the possible adverse reactions/side effects for all the Medicines you take and that have been prescribed to you. Take any new Medicines after you have completely understood and accept all the possible adverse reactions/side effects.   Please note  You were cared for by a hospitalist during your hospital stay. If you have any questions about your discharge medications or the care you received while you were in the hospital after you are discharged, you can call the unit and asked to speak with the hospitalist on call if the hospitalist that took care of you is not available. Once you are discharged, your  primary care physician will handle any further medical issues. Please note that NO REFILLS for any discharge medications will be authorized once you are discharged, as it is imperative that you return to your primary care physician (or establish a relationship with a primary care physician if you do not have one) for your aftercare needs so that they can reassess your need for medications and monitor your lab  values. Today   SUBJECTIVE   Patient is requesting to go home  VITAL SIGNS:  Blood pressure (!) 118/52, pulse 80, temperature 98.2 F (36.8 C), temperature source Oral, resp. rate 18, height 5\' 3"  (1.6 m), weight 45.4 kg (100 lb), SpO2 100 %.  I/O:   Intake/Output Summary (Last 24 hours) at 08/11/16 1100 Last data filed at 08/11/16 0942  Gross per 24 hour  Intake             3460 ml  Output                0 ml  Net             3460 ml    PHYSICAL EXAMINATION:  GENERAL:  83 y.o.-year-old patient lying in the bed with no acute distress.  EYES: Pupils equal, round, reactive to light and accommodation. No scleral icterus. Extraocular muscles intact.  HEENT: Head atraumatic, normocephalic. Oropharynx and nasopharynx clear.  NECK:  Supple, no jugular venous distention. No thyroid enlargement, no tenderness.  LUNGS: Normal breath sounds bilaterally, no wheezing, rales,rhonchi or crepitation. No use of accessory muscles of respiration.  CARDIOVASCULAR: S1, S2 normal. No murmurs, rubs, or gallops.  ABDOMEN: Soft, non-tender, non-distended. Bowel sounds present. No organomegaly or mass.  EXTREMITIES: No pedal edema, cyanosis, or clubbing. Left hip surgical incision looks okay NEUROLOGIC: Cranial nerves II through XII are intact. Muscle strength 5/5 in all extremities. Sensation intact. Gait not checked.  PSYCHIATRIC: The patient is alert and oriented x 3.  SKIN: No obvious rash, lesion, or ulcer.   DATA REVIEW:   CBC   Recent Labs Lab 08/09/16 0445 08/11/16 0403  WBC 11.2*  --   HGB 11.3* 9.8*  HCT 33.3*  --   PLT 224  --     Chemistries   Recent Labs Lab 08/09/16 0445  NA 139  K 3.9  CL 108  CO2 25  GLUCOSE 106*  BUN 15  CREATININE 0.77  CALCIUM 9.2    Microbiology Results   Recent Results (from the past 240 hour(s))  Surgical PCR screen     Status: None   Collection Time: 08/08/16 10:48 PM  Result Value Ref Range Status   MRSA, PCR NEGATIVE NEGATIVE Final    Staphylococcus aureus NEGATIVE NEGATIVE Final    Comment:        The Xpert SA Assay (FDA approved for NASAL specimens in patients over 38 years of age), is one component of a comprehensive surveillance program.  Test performance has been validated by [facility] for patients greater than or equal to 4 year old. It is not intended to diagnose infection nor to guide or monitor treatment.     RADIOLOGY:  Dg Hip Unilat With Pelvis 2-3 Views Left  Result Date: 08/09/2016 CLINICAL DATA:  Status post hip hemiarthroplasty. EXAM: DG HIP (WITH OR WITHOUT PELVIS) 2-3V LEFT COMPARISON:  Preoperative radiographs yesterday. FINDINGS: Left hip hemiarthroplasty in expected alignment. Femoral stem is midline. Recent postsurgical change includes surgical drain and lateral skin staples. No new abnormality. IMPRESSION: Left hip hemiarthroplasty without immediate  postprocedural complication. Electronically Signed   By: Jeb Levering M.D.   On: 08/09/2016 21:59     Management plans discussed with the patient, family and they are in agreement.  CODE STATUS:     Code Status Orders        Start     Ordered   08/08/16 1753  Full code  Continuous     08/08/16 1752    Code Status History    Date Active Date Inactive Code Status Order ID Comments User Context   This patient has a current code status but no historical code status.    Advance Directive Documentation     Most Recent Value  Type of Advance Directive  Living will, Healthcare Power of Attorney  Pre-existing out of facility DNR order (yellow form or pink MOST form)  -  "MOST" Form in Place?  -      TOTAL TIME TAKING CARE OF THIS PATIENT: 40** minutes.    Geriann Lafont M.D on 08/11/2016 at 11:00 AM  Between 7am to 6pm - Pager - [phone] After 6pm go to [email or web address] - password [email or web address] [place] [facility]  Office  [phone]  CC: Primary care physician; [name], MD

## 2016-08-11 NOTE — Discharge Instructions (Signed)
HIP HEMIARTHROPLASTY POSTOPERATIVE DIRECTIONS  Hip Rehabilitation, Guidelines Following Surgery  The results of a hip operation are greatly improved after range of motion and muscle strengthening exercises. Follow all safety measures which are given to protect your hip. If any of these exercises cause increased pain or swelling in your joint, decrease the amount until you are comfortable again. Then slowly increase the exercises. Call your caregiver if you have problems or questions.   HOME CARE INSTRUCTIONS  Remove items at home which could result in a fall. This includes throw rugs or furniture in walking pathways.   ICE to the affected hip every three hours for 30 minutes at a time and then as needed for pain and swelling.  Continue to use ice on the hip for pain and swelling from surgery. You may notice swelling that will progress down to the foot and ankle.  This is normal after surgery.  Elevate the leg when you are not up walking on it.    Continue to use the breathing machine which will help keep your temperature down.  It is common for your temperature to cycle up and down following surgery, especially at night when you are not up moving around and exerting yourself.  The breathing machine keeps your lungs expanded and your temperature down.  DIET You may resume your previous home diet once your are discharged from the hospital.  DRESSING / WOUND CARE / SHOWERING You may start showering once staples have been removed. Change dressing as needed.    ACTIVITY Walk with your walker as instructed. Use walker as long as suggested by your caregivers. Avoid periods of inactivity such as sitting longer than an hour when not asleep. This helps prevent blood clots.  You may resume a sexual relationship in one month or when given the OK by your doctor.  You may return to work once you are cleared by your doctor.  Do not drive a car for 6 weeks or until released by you surgeon.  Do not  drive while taking narcotics.  WEIGHT BEARING Weight bearing as tolerated  POSTOPERATIVE CONSTIPATION PROTOCOL Constipation - defined medically as fewer than three stools per week and severe constipation as less than one stool per week.  One of the most common issues patients have following surgery is constipation.  Even if you have a regular bowel pattern at home, your normal regimen is likely to be disrupted due to multiple reasons following surgery.  Combination of anesthesia, postoperative narcotics, change in appetite and fluid intake all can affect your bowels.  In order to avoid complications following surgery, here are some recommendations in order to help you during your recovery period.  Colace (docusate) - Pick up an over-the-counter form of Colace or another stool softener and take twice a day as long as you are requiring postoperative pain medications.  Take with a full glass of water daily.  If you experience loose stools or diarrhea, hold the colace until you stool forms back up.  If your symptoms do not get better within 1 week or if they get worse, check with your doctor.  Dulcolax (bisacodyl) - Pick up over-the-counter and take as directed by the product packaging as needed to assist with the movement of your bowels.  Take with a full glass of water.  Use this product as needed if not relieved by Colace only.   MiraLax (polyethylene glycol) - Pick up over-the-counter to have on hand.  MiraLax is a solution that will  increase the amount of water in your bowels to assist with bowel movements.  Take as directed and can mix with a glass of water, juice, soda, coffee, or tea.  Take if you go more than two days without a movement. Do not use MiraLax more than once per day. Call your doctor if you are still constipated or irregular after using this medication for 7 days in a row.  If you continue to have problems with postoperative constipation, please contact the office for further  assistance and recommendations.  If you experience "the worst abdominal pain ever" or develop nausea or vomiting, please contact the office immediatly for further recommendations for treatment.  ITCHING  If you experience itching with your medications, try taking only a single pain pill, or even half a pain pill at a time.  You can also use Benadryl over the counter for itching or also to help with sleep.   TED HOSE STOCKINGS Wear the elastic stockings on both legs for six weeks following surgery during the day but you may remove then at night for sleeping.  MEDICATIONS See your medication summary on the After Visit Summary that the nursing staff will review with you prior to discharge.  You may have some home medications which will be placed on hold until you complete the course of blood thinner medication.  It is important for you to complete the blood thinner medication as prescribed by your surgeon.  Continue your approved medications as instructed at time of discharge.  PRECAUTIONS If you experience chest pain or shortness of breath - call 911 immediately for transfer to the hospital emergency department.  If you develop a fever greater that 101 F, purulent drainage from wound, increased redness or drainage from wound, foul odor from the wound/dressing, or calf pain - CONTACT YOUR SURGEON.                                                   FOLLOW-UP APPOINTMENTS Make sure you keep all of your appointments after your operation with your surgeon and caregivers. You should call the office at the above phone number and make an appointment for approximately two weeks after the date of your surgery or on the date instructed by your surgeon outlined in the "After Visit Summary".  RANGE OF MOTION AND STRENGTHENING EXERCISES  These exercises are designed to help you keep full movement of your hip joint. Follow your caregiver's or physical therapist's instructions. Perform all exercises about fifteen  times, three times per day or as directed. Exercise both hips, even if you have had only one joint replacement. These exercises can be done on a training (exercise) mat, on the floor, on a table or on a bed. Use whatever works the best and is most comfortable for you. Use music or television while you are exercising so that the exercises are a pleasant break in your day. This will make your life better with the exercises acting as a break in routine you can look forward to.  Lying on your back, slowly slide your foot toward your buttocks, raising your knee up off the floor. Then slowly slide your foot back down until your leg is straight again.  Lying on your back spread your legs as far apart as you can without causing discomfort.  Lying on your side, raise your  upper leg and foot straight up from the floor as far as is comfortable. Slowly lower the leg and repeat.  Lying on your back, tighten up the muscle in the front of your thigh (quadriceps muscles). You can do this by keeping your leg straight and trying to raise your heel off the floor. This helps strengthen the largest muscle supporting your knee.  Lying on your back, tighten up the muscles of your buttocks both with the legs straight and with the knee bent at a comfortable angle while keeping your heel on the floor.      IF YOU ARE TRANSFERRED TO A SKILLED REHAB FACILITY If the patient is transferred to a skilled rehab facility following release from the hospital, a list of the current medications will be sent to the facility for the patient to continue.  When discharged from the skilled rehab facility, please have the facility set up the patient's [place] prior to being released. Also, the skilled facility will be responsible for providing the patient with their medications at time of release from the facility to include their pain medication, the muscle relaxants, and their blood thinner medication. If the patient is still  at the rehab facility at time of the two week follow up appointment, the skilled rehab facility will also need to assist the patient in arranging follow up appointment in our office and any transportation needs.  MAKE SURE YOU:  Understand these instructions.  Get help right away if you are not doing well or get worse.    Pick up stool softner and laxative for home use following surgery while on pain medications. Continue to use ice for pain and swelling after surgery. Do not use any lotions or creams on the incision until instructed by your surgeon.

## 2016-08-11 NOTE — Progress Notes (Signed)
[facility]. Pt's son in law and husband at bedside. This Probation officer reviewed d/c instructions with all three of them, including script for tramadol. This Probation officer explained the direction to call MD offices in the am to schedule follow up appointments for pt, currently. MD offices are closed and so staff cannot provide that service today,. All verbalized understanding. This Probation officer also explained the scripts already transmitted to the pharmacy, and explained that per MD, pt should resume aggrenox at home and still take lovenox as directed, explained that lovenox will be time limited. All were aware that home health would be visiting, as well as outpatient physical therapy. This Probation officer strongly cautioned pt that she should not get up without her walker, even if she feels that she can. Pt stated she understood. Pt escorted to front entrance via wc at approx 1630, accompanied by family.

## 2016-08-11 NOTE — Progress Notes (Signed)
Pt has worked with physical therapy and is cleared for home d/c providing family support is available. Pt's daughter, who lives across the street from pt, is in to visit pt, this Probation officer spoke to her and confirmed that she is willing to be a resource for pt. She stated she will be out of town today until 6pm, but that pt's husband could take pt home and daughter's husband will be available to assist pt out of the car and into the house,etc. This Probation officer paged Dr. Posey Pronto with this information. Case management made aware as well.

## 2016-08-11 NOTE — Progress Notes (Signed)
   Subjective: 2 Days Post-Op Procedure(s) (LRB): ARTHROPLASTY BIPOLAR HIP (HEMIARTHROPLASTY) (Left) Patient reports pain as 0 on 0-10 scale.   Patient is well, and has had no acute complaints or problems Denies any CP, SOB, ABD pain. Patient tolerated physical therapy well this morning. She is requesting to go home.   Objective: Vital signs in last 24 hours: Temp:  [98.2 F (36.8 C)] 98.2 F (36.8 C) (05/13 0000) Pulse Rate:  [80-90] 80 (05/13 0000) Resp:  [17-18] 18 (05/13 0000) BP: (117-118)/(51-52) 118/52 (05/13 0000) SpO2:  [96 %-100 %] 100 % (05/13 0000)  Intake/Output from previous day: 05/12 0701 - 05/13 0700 In: 2440 [P.O.:840; I.V.:2500] Out: -  Intake/Output this shift: Total I/O In: 240 [P.O.:240] Out: -    Recent Labs  08/08/16 1514 08/09/16 0445 08/11/16 0403  HGB 12.3 11.3* 9.8*    Recent Labs  08/08/16 1514 08/09/16 0445  WBC 11.3* 11.2*  RBC 3.61* 3.41*  HCT 35.4 33.3*  PLT 268 224    Recent Labs  08/08/16 1514 08/09/16 0445  NA 140 139  K 3.6 3.9  CL 104 108  CO2 27 25  BUN 12 15  CREATININE 1.04* 0.77  GLUCOSE 117* 106*  CALCIUM 10.1 9.2    Recent Labs  08/08/16 1514  INR 1.04    EXAM General - Patient is Alert, Appropriate and Oriented Extremity - Neurovascular intact Sensation intact distally Intact pulses distally Dorsiflexion/Plantar flexion intact Incision: dressing C/D/I and no drainage No cellulitis present Compartment soft Dressing - dressing C/D/I and no drainage, Scant drainage, Hemovac removed earlier today. New dressing applied.  Motor Function - intact, moving foot and toes well on exam.   Past Medical History:  Diagnosis Date  . Arthritis   . High cholesterol 02/29/2016  . Stroke ([place])   . TIA (transient ischemic attack) 02/29/2016    Assessment/Plan:   2 Days Post-Op Procedure(s) (LRB): ARTHROPLASTY BIPOLAR HIP (HEMIARTHROPLASTY) (Left) Active Problems:   Hip fracture ([facility])   Acute post op  blood loss anemia   Estimated body mass index is 17.71 kg/m as calculated from the following:   Height as of this encounter: 5\' 3"  (1.6 m).   Weight as of this encounter: 45.4 kg (100 lb). Advance diet Up with therapy  CM to assist with discharge Acute post op blood loss anemia - Hgb 9.8. Patient tolerated physical therapy well this morning. She is requesting to go home, states he has 2 daughters, one of which can stay with her. Also has a husband that can be of assistance. Okay to discharge home with home health PT pending safe completion of steps with physical therapy. Patient will follow-up with [facility] in 6 weeks. Please remove staples and apply Steri-Strips on 08/23/2016. Compression stockings 6 weeks, remove at nighttime     DVT Prophylaxis - Lovenox, Foot Pumps and TED hose Weight-Bearing as tolerated to left leg   T. Rachelle Hora, PA-C [place] 08/11/2016, 10:18 AM

## 2016-08-11 NOTE — Clinical Social Work Note (Addendum)
CSW informed by attending that the patient would like to return home. CSW will continue to follow should she choose to pursue SNF.  CSW signing off as PT recommendation has changed to [place]. Please consult should new needs arise.   Santiago Bumpers, MSW, Latanya Presser [phone]

## 2016-08-11 NOTE — Progress Notes (Signed)
Shift assessment completed. Pt has been oob to bathroom, staff found hemovac lying loosely in pt's bed, unattached. Pt was standby assisst to recliner. Pt is alert and oriented, on room air, lungs are clear bilat. Hr is regular, abdomen is soft, bs heard. l hip has honeycomb dressing intact with scant old drainage noted. hemovac site with scant bloody drainage. teds on bilat, ppp, no edema noted. piv 320 intact to l hand, piv #20 intact to rac, both istes are free of redness and swelling. Since assessment, pt has had Bm, stated that she wants to go home. Pt has call bell in reach. Chair alarm in place as pt attempts to walk without assist.

## 2016-08-11 NOTE — Care Management Note (Signed)
Case Management Note  Patient Details  Name: [name] MRN: [id number] Date of Birth: [birth year]  Subjective/Objective:      [name] resides near [place]. and a referral was called to Ardeen Fillers at [facility] with a referral for HH-PT.  A referral for a RW was called to [place] at [facility].to be delivered to [name] room today.            Action/Plan:   Expected Discharge Date:  08/11/16               Expected Discharge Plan:   08/11/16  In-House Referral:     Discharge planning Services     Post Acute Care Choice:   Home with [facility] Choice offered to:   Patient  DME Arranged:   RW D[facility]:   [facility]  [facility] Arranged:   PT [place] Agency:   [facility]  Status of Service:   Completed.  If discussed at [place] of Stay Meetings, dates discussed:    Additional Comments:  Chip Canepa A, RN 08/11/2016, 1:34 PM

## 2016-08-12 ENCOUNTER — Encounter: Payer: Self-pay | Admitting: Orthopedic Surgery

## 2016-08-12 NOTE — Anesthesia Postprocedure Evaluation (Signed)
Anesthesia Post Note  Patient: [name]  Procedure(s) Performed: Procedure(s) (LRB): ARTHROPLASTY BIPOLAR HIP (HEMIARTHROPLASTY) (Left)  Patient location during evaluation: PACU Anesthesia Type: General Level of consciousness: awake and alert Pain management: pain level controlled Vital Signs Assessment: post-procedure vital signs reviewed and stable Respiratory status: spontaneous breathing, nonlabored ventilation, respiratory function stable and patient connected to nasal cannula oxygen Cardiovascular status: blood pressure returned to baseline and stable Postop Assessment: no signs of nausea or vomiting Anesthetic complications: no     Last Vitals:  Vitals:   08/10/16 1354 08/11/16 0000  BP: (!) 117/51 (!) 118/52  Pulse: 90 80  Resp: 17 18  Temp:  36.8 C    Last Pain:  Vitals:   08/11/16 0000  TempSrc: Oral  PainSc:                  Naif Alabi S

## 2016-08-13 ENCOUNTER — Encounter: Payer: Self-pay | Admitting: Orthopedic Surgery

## 2016-08-13 LAB — SURGICAL PATHOLOGY

## 2016-08-14 DIAGNOSIS — W19XXXD Unspecified fall, subsequent encounter: Secondary | ICD-10-CM | POA: Diagnosis not present

## 2016-08-14 DIAGNOSIS — Z96642 Presence of left artificial hip joint: Secondary | ICD-10-CM | POA: Diagnosis not present

## 2016-08-14 DIAGNOSIS — S72002D Fracture of unspecified part of neck of left femur, subsequent encounter for closed fracture with routine healing: Secondary | ICD-10-CM | POA: Diagnosis not present

## 2016-08-14 DIAGNOSIS — Z8673 Personal history of transient ischemic attack (TIA), and cerebral infarction without residual deficits: Secondary | ICD-10-CM | POA: Diagnosis not present

## 2016-08-14 DIAGNOSIS — Z9181 History of falling: Secondary | ICD-10-CM | POA: Diagnosis not present

## 2016-08-14 DIAGNOSIS — M1990 Unspecified osteoarthritis, unspecified site: Secondary | ICD-10-CM | POA: Diagnosis not present

## 2016-08-16 DIAGNOSIS — S72002D Fracture of unspecified part of neck of left femur, subsequent encounter for closed fracture with routine healing: Secondary | ICD-10-CM | POA: Diagnosis not present

## 2016-08-16 DIAGNOSIS — W19XXXD Unspecified fall, subsequent encounter: Secondary | ICD-10-CM | POA: Diagnosis not present

## 2016-08-16 DIAGNOSIS — Z96642 Presence of left artificial hip joint: Secondary | ICD-10-CM | POA: Diagnosis not present

## 2016-08-16 DIAGNOSIS — M1990 Unspecified osteoarthritis, unspecified site: Secondary | ICD-10-CM | POA: Diagnosis not present

## 2016-08-16 DIAGNOSIS — Z9181 History of falling: Secondary | ICD-10-CM | POA: Diagnosis not present

## 2016-08-16 DIAGNOSIS — Z8673 Personal history of transient ischemic attack (TIA), and cerebral infarction without residual deficits: Secondary | ICD-10-CM | POA: Diagnosis not present

## 2016-08-20 DIAGNOSIS — Z9181 History of falling: Secondary | ICD-10-CM | POA: Diagnosis not present

## 2016-08-20 DIAGNOSIS — W19XXXD Unspecified fall, subsequent encounter: Secondary | ICD-10-CM | POA: Diagnosis not present

## 2016-08-20 DIAGNOSIS — Z8673 Personal history of transient ischemic attack (TIA), and cerebral infarction without residual deficits: Secondary | ICD-10-CM | POA: Diagnosis not present

## 2016-08-20 DIAGNOSIS — Z96642 Presence of left artificial hip joint: Secondary | ICD-10-CM | POA: Diagnosis not present

## 2016-08-20 DIAGNOSIS — M1990 Unspecified osteoarthritis, unspecified site: Secondary | ICD-10-CM | POA: Diagnosis not present

## 2016-08-20 DIAGNOSIS — S72002D Fracture of unspecified part of neck of left femur, subsequent encounter for closed fracture with routine healing: Secondary | ICD-10-CM | POA: Diagnosis not present

## 2016-08-21 DIAGNOSIS — Z681 Body mass index (BMI) 19 or less, adult: Secondary | ICD-10-CM | POA: Diagnosis not present

## 2016-08-21 DIAGNOSIS — Z4802 Encounter for removal of sutures: Secondary | ICD-10-CM | POA: Diagnosis not present

## 2016-08-21 DIAGNOSIS — Z1389 Encounter for screening for other disorder: Secondary | ICD-10-CM | POA: Diagnosis not present

## 2016-08-21 DIAGNOSIS — S72002D Fracture of unspecified part of neck of left femur, subsequent encounter for closed fracture with routine healing: Secondary | ICD-10-CM | POA: Diagnosis not present

## 2016-08-22 DIAGNOSIS — Z9181 History of falling: Secondary | ICD-10-CM | POA: Diagnosis not present

## 2016-08-22 DIAGNOSIS — Z96642 Presence of left artificial hip joint: Secondary | ICD-10-CM | POA: Diagnosis not present

## 2016-08-22 DIAGNOSIS — M1990 Unspecified osteoarthritis, unspecified site: Secondary | ICD-10-CM | POA: Diagnosis not present

## 2016-08-22 DIAGNOSIS — S72002D Fracture of unspecified part of neck of left femur, subsequent encounter for closed fracture with routine healing: Secondary | ICD-10-CM | POA: Diagnosis not present

## 2016-08-22 DIAGNOSIS — Z8673 Personal history of transient ischemic attack (TIA), and cerebral infarction without residual deficits: Secondary | ICD-10-CM | POA: Diagnosis not present

## 2016-08-22 DIAGNOSIS — W19XXXD Unspecified fall, subsequent encounter: Secondary | ICD-10-CM | POA: Diagnosis not present

## 2016-08-27 DIAGNOSIS — S72002D Fracture of unspecified part of neck of left femur, subsequent encounter for closed fracture with routine healing: Secondary | ICD-10-CM | POA: Diagnosis not present

## 2016-08-27 DIAGNOSIS — Z8673 Personal history of transient ischemic attack (TIA), and cerebral infarction without residual deficits: Secondary | ICD-10-CM | POA: Diagnosis not present

## 2016-08-27 DIAGNOSIS — W19XXXD Unspecified fall, subsequent encounter: Secondary | ICD-10-CM | POA: Diagnosis not present

## 2016-08-27 DIAGNOSIS — Z9181 History of falling: Secondary | ICD-10-CM | POA: Diagnosis not present

## 2016-08-27 DIAGNOSIS — M1990 Unspecified osteoarthritis, unspecified site: Secondary | ICD-10-CM | POA: Diagnosis not present

## 2016-08-27 DIAGNOSIS — Z96642 Presence of left artificial hip joint: Secondary | ICD-10-CM | POA: Diagnosis not present

## 2016-08-29 DIAGNOSIS — S72002D Fracture of unspecified part of neck of left femur, subsequent encounter for closed fracture with routine healing: Secondary | ICD-10-CM | POA: Diagnosis not present

## 2016-08-29 DIAGNOSIS — M1990 Unspecified osteoarthritis, unspecified site: Secondary | ICD-10-CM | POA: Diagnosis not present

## 2016-08-29 DIAGNOSIS — W19XXXD Unspecified fall, subsequent encounter: Secondary | ICD-10-CM | POA: Diagnosis not present

## 2016-08-29 DIAGNOSIS — Z96642 Presence of left artificial hip joint: Secondary | ICD-10-CM | POA: Diagnosis not present

## 2016-08-29 DIAGNOSIS — Z8673 Personal history of transient ischemic attack (TIA), and cerebral infarction without residual deficits: Secondary | ICD-10-CM | POA: Diagnosis not present

## 2016-08-29 DIAGNOSIS — Z9181 History of falling: Secondary | ICD-10-CM | POA: Diagnosis not present

## 2016-09-18 DIAGNOSIS — S72002D Fracture of unspecified part of neck of left femur, subsequent encounter for closed fracture with routine healing: Secondary | ICD-10-CM | POA: Diagnosis not present

## 2016-09-24 DIAGNOSIS — M25552 Pain in left hip: Secondary | ICD-10-CM | POA: Diagnosis not present

## 2016-09-30 DIAGNOSIS — E2839 Other primary ovarian failure: Secondary | ICD-10-CM | POA: Diagnosis not present

## 2016-09-30 DIAGNOSIS — Z79899 Other long term (current) drug therapy: Secondary | ICD-10-CM | POA: Diagnosis not present

## 2016-09-30 DIAGNOSIS — E7800 Pure hypercholesterolemia, unspecified: Secondary | ICD-10-CM | POA: Diagnosis not present

## 2016-09-30 DIAGNOSIS — M810 Age-related osteoporosis without current pathological fracture: Secondary | ICD-10-CM | POA: Diagnosis not present

## 2016-09-30 DIAGNOSIS — Z7982 Long term (current) use of aspirin: Secondary | ICD-10-CM | POA: Diagnosis not present

## 2016-11-05 DIAGNOSIS — Z96649 Presence of unspecified artificial hip joint: Secondary | ICD-10-CM | POA: Diagnosis not present

## 2016-12-03 ENCOUNTER — Ambulatory Visit (HOSPITAL_COMMUNITY)
Admission: RE | Admit: 2016-12-03 | Discharge: 2016-12-03 | Disposition: A | Discharge status: Home / Self Care | Payer: Medicare HMO | Source: Ambulatory Visit | Attending: Family Medicine | Admitting: Family Medicine

## 2016-12-03 ENCOUNTER — Other Ambulatory Visit (HOSPITAL_COMMUNITY): Payer: Self-pay | Admitting: Family Medicine

## 2016-12-03 DIAGNOSIS — M545 Low back pain: Secondary | ICD-10-CM

## 2016-12-03 DIAGNOSIS — Z1389 Encounter for screening for other disorder: Secondary | ICD-10-CM | POA: Diagnosis not present

## 2016-12-03 DIAGNOSIS — M438X6 Other specified deforming dorsopathies, lumbar region: Secondary | ICD-10-CM | POA: Diagnosis not present

## 2016-12-03 DIAGNOSIS — Z681 Body mass index (BMI) 19 or less, adult: Secondary | ICD-10-CM | POA: Diagnosis not present

## 2016-12-19 ENCOUNTER — Emergency Department: Payer: Medicare HMO

## 2016-12-19 ENCOUNTER — Encounter: Payer: Self-pay | Admitting: Emergency Medicine

## 2016-12-19 ENCOUNTER — Emergency Department
Admission: EM | Admit: 2016-12-19 | Discharge: 2016-12-19 | Disposition: A | Discharge status: Home / Self Care | Payer: Medicare HMO | Attending: Emergency Medicine | Admitting: Emergency Medicine

## 2016-12-19 DIAGNOSIS — Z8673 Personal history of transient ischemic attack (TIA), and cerebral infarction without residual deficits: Secondary | ICD-10-CM | POA: Insufficient documentation

## 2016-12-19 DIAGNOSIS — S32040A Wedge compression fracture of fourth lumbar vertebra, initial encounter for closed fracture: Secondary | ICD-10-CM | POA: Diagnosis not present

## 2016-12-19 DIAGNOSIS — M79662 Pain in left lower leg: Secondary | ICD-10-CM | POA: Diagnosis not present

## 2016-12-19 DIAGNOSIS — Y9389 Activity, other specified: Secondary | ICD-10-CM | POA: Insufficient documentation

## 2016-12-19 DIAGNOSIS — M545 Low back pain: Secondary | ICD-10-CM | POA: Diagnosis not present

## 2016-12-19 DIAGNOSIS — Y929 Unspecified place or not applicable: Secondary | ICD-10-CM | POA: Diagnosis not present

## 2016-12-19 DIAGNOSIS — Z7901 Long term (current) use of anticoagulants: Secondary | ICD-10-CM | POA: Insufficient documentation

## 2016-12-19 DIAGNOSIS — Z96642 Presence of left artificial hip joint: Secondary | ICD-10-CM | POA: Diagnosis not present

## 2016-12-19 DIAGNOSIS — S3992XA Unspecified injury of lower back, initial encounter: Secondary | ICD-10-CM | POA: Diagnosis not present

## 2016-12-19 DIAGNOSIS — X500XXA Overexertion from strenuous movement or load, initial encounter: Secondary | ICD-10-CM | POA: Insufficient documentation

## 2016-12-19 DIAGNOSIS — Y998 Other external cause status: Secondary | ICD-10-CM | POA: Insufficient documentation

## 2016-12-19 DIAGNOSIS — S8992XA Unspecified injury of left lower leg, initial encounter: Secondary | ICD-10-CM | POA: Diagnosis not present

## 2016-12-19 DIAGNOSIS — Z79899 Other long term (current) drug therapy: Secondary | ICD-10-CM | POA: Diagnosis not present

## 2016-12-19 MED ORDER — PREDNISONE 50 MG PO TABS: 50.0000 mg | ORAL_TABLET | Freq: Every day | ORAL | 0 refills | Status: DC

## 2016-12-19 MED ORDER — HYDROCODONE-ACETAMINOPHEN 5-325 MG PO TABS: 1.0000 | ORAL_TABLET | ORAL | 0 refills | Status: DC | PRN

## 2016-12-19 MED ORDER — OXYCODONE-ACETAMINOPHEN 5-325 MG PO TABS
1.0000 | ORAL_TABLET | Freq: Once | ORAL | Status: AC
  Administered 2016-12-19: 1 via ORAL
  Filled 2016-12-19: qty 1

## 2016-12-19 MED ORDER — METHOCARBAMOL 500 MG PO TABS: 500.0000 mg | ORAL_TABLET | Freq: Four times a day (QID) | ORAL | 0 refills | Status: DC

## 2016-12-19 NOTE — ED Triage Notes (Signed)
Patient presents to the ED with pain, "all over, but especially in my legs."  Patient reports falling approx. 1.5 weeks ago when she bent over to pick up something heavy.  Patient had a hip replacement surgery after a fall in June or July but had recovered well from that injury prior to this fall.  Patient states, "I heard some popping in my back when I fell."  Patient is ambulatory to triage.  Patient states, "It hurts so bad I can't concentrate."  Patient states she has taken otc pain medication without relief.  Patient has not had prescription pain medication.

## 2016-12-19 NOTE — ED Notes (Signed)
See triage note  States she bent to pick up something about 1 1/2 weeks ago  Developed pain to lower back which is moving into both legs  Ambulates slowly d/t pain

## 2016-12-19 NOTE — ED Provider Notes (Signed)
[facility] Emergency Department Provider Note  ____________________________________________  Time seen: Approximately 3:02 PM  I have reviewed the triage vital signs and the nursing notes.   HISTORY  Chief Complaint Fall and Leg Pain    HPI [name] is a 83 y.o. female who presents to emergency department complaining of lower back pain and bilateral hip pain. Patient reports that she had a fall requiring hip replacement to the left hip earlier this year. Patient did well with surgery has had no cough location. Approximately 10 days ago she was lifting something heavy, felt a pop in her lower back. Since then she has had increasing lower back pain with pain into bilateral hips. She denies any bowel or bladder dysfunction, saddle anesthesia, paresthesias. Pain has been increasing as a tight/throbbing, sharp sensation. She states that she has some mild numbness and tingling in bilateral feet but no complete numbness. No change in gait. Patient is tried over-the-counter medications but no prescription medications for this complaint. No other injury or complaint at this time.   Past Medical History:  Diagnosis Date  . Arthritis   . High cholesterol 02/29/2016  . Stroke ([place])   . TIA (transient ischemic attack) 02/29/2016    Patient Active Problem List   Diagnosis Date Noted  . Hip fracture ([place]) 08/08/2016  . Melena 03/05/2016  . Absolute anemia 03/05/2016  . TIA (transient ischemic attack) 02/29/2016  . High cholesterol 02/29/2016    Past Surgical History:  Procedure Laterality Date  . APPENDECTOMY    . CATARACT EXTRACTION W/PHACO Right 11/22/2014   Procedure: CATARACT EXTRACTION PHACO AND INTRAOCULAR LENS PLACEMENT ([place]);  Surgeon: Rutherford Guys, MD;  Location: [facility];  Service: Ophthalmology;  Laterality: Right;  CDE:5.05  . CATARACT EXTRACTION W/PHACO Left 12/13/2014   Procedure: CATARACT EXTRACTION PHACO AND INTRAOCULAR LENS PLACEMENT (IOC);   Surgeon: Rutherford Guys, MD;  Location: [facility];  Service: Ophthalmology;  Laterality: Left;  CDE:7.17  . COLONOSCOPY N/A 03/11/2016   Procedure: COLONOSCOPY;  Surgeon: Rogene Houston, MD;  Location: [facility] ENDO SUITE;  Service: Endoscopy;  Laterality: N/A;  7:30  . ESOPHAGOGASTRODUODENOSCOPY N/A 03/11/2016   Procedure: ESOPHAGOGASTRODUODENOSCOPY (EGD);  Surgeon: Rogene Houston, MD;  Location: [facility] SU[facility];  Service: Endoscopy;  Laterality: N/A;  . HIP ARTHROPLASTY Left 08/09/2016   Procedure: ARTHROPLASTY BIPOLAR HIP (HEMIARTHROPLASTY);  Surgeon: Dereck Leep, MD;  Location: [facility] ORS;  Service: Orthopedics;  Laterality: Left;    Prior to Admission medications   Medication Sig Start Date End Date Taking? Authorizing Provider  bismuth-metronidazole-tetracycline [facility]) [phone] MG capsule Take 3 capsules by mouth 4 (four) times daily -  before meals and at bedtime. Patient not taking: Reported on 08/08/2016 03/15/16   Rogene Houston, MD  dipyridamole-aspirin (AGGRENOX) 200-25 MG 12hr capsule Take 1 capsule by mouth 2 (two) times daily. 03/12/16   Rehman, Mechele Dawley, MD  enoxaparin (LOVENOX) 40 MG/0.4ML injection Inject 0.4 mLs (40 mg total) into the skin daily. 08/12/16   Fritzi Mandes, MD  ferrous sulfate 325 (65 FE) MG tablet Take 1 tablet (325 mg total) by mouth 2 (two) times daily with a meal. 08/11/16   Fritzi Mandes, MD  HYDROcodone-acetaminophen (NORCO/VICODIN) 5-325 MG tablet Take 1 tablet by mouth every 4 (four) hours as needed for moderate pain. 12/19/16   Zeba Luby, Charline Bills, PA-C  methocarbamol (ROBAXIN) 500 MG tablet Take 1 tablet (500 mg total) by mouth 4 (four) times daily. 12/19/16   Dynastie Knoop, Charline Bills, PA-C  Multiple Vitamin (  MULTIVITAMIN WITH MINERALS) TABS tablet Take 1 tablet by mouth daily.    [provider]  predniSONE (DELTASONE) 50 MG tablet Take 1 tablet (50 mg total) by mouth daily with breakfast. 12/19/16   Kalasia Crafton, Charline Bills, PA-C  simvastatin (ZOCOR) 20 MG  tablet Take 20 mg by mouth every morning.    [provider]  traMADol (ULTRAM) 50 MG tablet Take 1 tablet (50 mg total) by mouth every 8 (eight) hours as needed for moderate pain. 08/11/16   Fritzi Mandes, MD    Allergies Patient has no known allergies.  No family history on file.  Social History Social History  Substance Use Topics  . Smoking status: Never Smoker  . Smokeless tobacco: Never Used  . Alcohol use No     Review of Systems  Constitutional: No fever/chills Eyes: No visual changes.  Cardiovascular: no chest pain. Respiratory: no cough. No SOB. Gastrointestinal: No abdominal pain.  No nausea, no vomiting.  No diarrhea.  No constipation. Genitourinary: Negative for dysuria. No hematuria Musculoskeletal: Positive for lower back pain with bilateral hip pain. Skin: Negative for rash, abrasions, lacerations, ecchymosis. Neurological: Negative for headaches, focal weakness or numbness. 10-point ROS otherwise negative.  ____________________________________________   PHYSICAL EXAM:  VITAL SIGNS: ED Triage Vitals  Enc Vitals Group     BP 12/19/16 1421 102/63     Pulse Rate 12/19/16 1421 81     Resp 12/19/16 1421 18     Temp 12/19/16 1421 98.2 F (36.8 C)     Temp Source 12/19/16 1421 Oral     SpO2 12/19/16 1421 99 %     Weight 12/19/16 1422 100 lb (45.4 kg)     Height 12/19/16 1422 5\' 3"  (1.6 m)     Head Circumference --      Peak Flow --      Pain Score 12/19/16 1421 8     Pain Loc --      Pain Edu? --      Excl. in [place]? --      Constitutional: Alert and oriented. Well appearing and in no acute distress. Eyes: Conjunctivae are normal. PERRL. EOMI. Head: Atraumatic. Neck: No stridor.    Cardiovascular: Normal rate, regular rhythm. Normal S1 and S2.  Good peripheral circulation. Respiratory: Normal respiratory effort without tachypnea or retractions. Lungs CTAB. Good air entry to the bases with no decreased or absent breath sounds. Gastrointestinal:  Bowel sounds 4 quadrants. Soft and nontender to palpation. No guarding or rigidity. No palpable masses. No distention. No CVA tenderness. Musculoskeletal: Full range of motion to all extremities. No gross deformities appreciated.No deformities despite upon inspection. Patient is mildly, diffusely tender, to palpation throughout the lumbar region both midline and paraspinal muscle groups. No palpable abnormality or step-off. Patient is mildly tender to palpation over both SI joints with no palpable abnormality. No tenderness to palpation over sciatic notches. Negative straight leg raise bilaterally. Patient is mildly tender to palpation over the left greater trochanteric region with no palpable abnormality. Full range of motion to the left hip. Examination of bilateral knees and ankles are unremarkable. Dorsalis pedis pulse intact bilateral lower extremities. Sensation intact and equal bilateral lower extremity's. Neurologic:  Normal speech and language. No gross focal neurologic deficits are appreciated.  Skin:  Skin is warm, dry and intact. No rash noted. Psychiatric: Mood and affect are normal. Speech and behavior are normal. Patient exhibits appropriate insight and judgement.   ____________________________________________   LABS (all labs ordered are listed, but  only abnormal results are displayed)  Labs Reviewed - No data to display ____________________________________________  EKG   ____________________________________________  RADIOLOGY Diamantina [place] Javian Nudd, personally viewed and evaluated these images (plain radiographs) as part of my medical decision making, as well as reviewing the written report by the radiologist.  Dg Lumbar Spine Complete  Result Date: 12/19/2016 CLINICAL DATA:  Lower back and bilateral lower extremity pain after fall 10 days ago. EXAM: LUMBAR SPINE - COMPLETE 4+ VIEW COMPARISON:  None. FINDINGS: Moderate wedge deformity of L4 vertebral body is noted which  has progressed significantly since prior exam. This is concerning for acute to subacute fracture. No spondylolisthesis is noted. Mild degenerative disc disease is noted at L2-3. Diffuse osteopenia is noted. IMPRESSION: Moderate wedge compression deformity of L4 vertebral body is noted which has progressed significantly since prior exam, concerning for acute to subacute fracture. CT scan or MRI may be performed further evaluation. Electronically Signed   By: Marijo Conception, M.D.   On: 12/19/2016 15:45   Ct Lumbar Spine Wo Contrast  Result Date: 12/19/2016 CLINICAL DATA:  83 year old female with acute onset lumbar back pain while bending 1.5 weeks ago. Pain radiating to both legs. EXAM: CT LUMBAR SPINE WITHOUT CONTRAST TECHNIQUE: Multidetector CT imaging of the lumbar spine was performed without intravenous contrast administration. Multiplanar CT image reconstructions were also generated. COMPARISON:  Lumbar radiographs 1524 hours today and 12/03/2016. FINDINGS: Segmentation: Normal. Alignment: Stable from the radiographs earlier today with straightening of lumbar lordosis. Mild focal kyphosis at L3-L4. No spondylolisthesis. Vertebrae: Osteopenia. Compression fracture of the L4 superior endplate with 10% loss of vertebral body height, which does appear progressed since 12/03/2016. Mild retropulsion of the posterosuperior endplate. Subsequent mild to moderate spinal stenosis (series 4, image 64). The L4 pedicles and posterior elements appear intact. Other lumbar levels appear intact. The visible sacrum and SI joints appear intact. The T12 level is intact. No other No acute osseous abnormality identified. Paraspinal and other soft tissues: Mild paraspinal edema or inflammation at the L3 superior endplate level. Calcified aortic atherosclerosis. Otherwise negative visualized noncontrast abdominal viscera. Negative visualized posterior paraspinal soft tissues. Disc levels: Mild for age lumbar spine degeneration  overall. L3-L4: Mild disc space loss with disc bulge and superimposed endplate spurring and retropulsion. Mild to moderate facet and ligament flavum hypertrophy. Mild to moderate spinal stenosis. No L3 foraminal stenosis. L4-L5: Circumferential partially calcified disc bulge with broad-based posterior component. Endplate spurring. Moderate to severe ligament flavum hypertrophy is partially calcified. Mild to moderate facet hypertrophy. Moderate to severe spinal stenosis (series 4, image 76). No significant foraminal stenosis. IMPRESSION: 1. Osteopenia. Subacute appearing L4 compression fracture with 33% loss of vertebral body height does appear progressed since 12/03/2016. Retropulsion of the posterosuperior endplate combined with degenerative changes results in mild to moderate spinal stenosis at L3-L4. No other complicating features. 2. No other acute osseous abnormality identified. 3. Overall mild for age lumbar spine degeneration, although there is multifactorial moderate to severe degenerative spinal stenosis at L4-L5. Electronically Signed   By: Genevie Ann M.D.   On: 12/19/2016 16:43   Dg Hip Unilat W Or Wo Pelvis 2-3 Views Left  Result Date: 12/19/2016 CLINICAL DATA:  Bilateral lower extremity pain after fall 10 days ago. EXAM: DG HIP (WITH OR WITHOUT PELVIS) 2-3V LEFT COMPARISON:  Radiographs of Aug 09, 2016. FINDINGS: Status post left hip hemiarthroplasty. No fracture or dislocation is noted. Right hip joint appears normal. IMPRESSION: Status post left hip hemiarthroplasty. No acute abnormality seen  in the pelvis. Electronically Signed   By: Marijo Conception, M.D.   On: 12/19/2016 15:51    ____________________________________________    PROCEDURES  Procedure(s) performed:    Procedures    Medications  oxyCODONE-acetaminophen (PERCOCET/ROXICET) 5-325 MG per tablet 1 tablet (1 tablet Oral Given 12/19/16 1543)     ____________________________________________   INITIAL IMPRESSION /  ASSESSMENT AND PLAN / ED COURSE  Pertinent labs & imaging results that were available during my care of the patient were reviewed by me and considered in my medical decision making (see chart for details).  Review of the [place] CSRS was performed in accordance of the [place] prior to dispensing any controlled drugs.  Clinical Course as of Dec 20 1715  Thu Dec 19, 2016  1618 Patient presented with lower back pain with radicular symptoms into bilateral hips after lifting a heavy object 10 days ago. X-ray reveals compression fracture in the lumbar spine. Patient will be evaluated with CT scan for further visualization. At this time, MRI can be ordered outpatient as patient has no concerning neurological deficits at this time.  [JC]    Clinical Course User Index [JC] Meaghen Vecchiarelli, Charline Bills, PA-C    Patient's diagnosis is consistent with Lumbar L4 compression fracture. Patient had previous compression fracture with worsening on imaging. X-ray revealed worsening from previous film. CT scan was ordered with no significant spinal cord compression. No indication for MRI at this time the patient's neurovascular status is intact distally and reassuring imaging on CT and x-ray. At this time, patient will be treated with short course of steroids and muscle relaxer with pain medication to be taken as needed. Patient is advised extreme caution and not to use muscle relaxer and pain medication at the same time. Patient will follow-up with neurosurgery for compression fracture of her L4 vertebrae.  Patient is given ED precautions to return to the ED for any worsening or new symptoms.     ____________________________________________  FINAL CLINICAL IMPRESSION(S) / ED DIAGNOSES  Final diagnoses:  Closed compression fracture of fourth lumbar vertebra, initial encounter ([place])      NEW MEDICATIONS STARTED DURING THIS VISIT:  New Prescriptions   HYDROCODONE-ACETAMINOPHEN (NORCO/VICODIN) 5-325 MG TABLET    Take 1  tablet by mouth every 4 (four) hours as needed for moderate pain.   METHOCARBAMOL (ROBAXIN) 500 MG TABLET    Take 1 tablet (500 mg total) by mouth 4 (four) times daily.   PREDNISONE (DELTASONE) 50 MG TABLET    Take 1 tablet (50 mg total) by mouth daily with breakfast.        This chart was dictated using voice recognition software/Dragon. Despite best efforts to proofread, errors can occur which can change the meaning. Any change was purely unintentional.    Darletta Moll, PA-C 12/19/16 1718    Clearnce Hasten Randall An, MD 12/19/16 [phone]

## 2016-12-21 DIAGNOSIS — R69 Illness, unspecified: Secondary | ICD-10-CM | POA: Diagnosis not present

## 2017-01-13 DIAGNOSIS — D509 Iron deficiency anemia, unspecified: Secondary | ICD-10-CM | POA: Diagnosis not present

## 2017-01-13 DIAGNOSIS — Z0001 Encounter for general adult medical examination with abnormal findings: Secondary | ICD-10-CM | POA: Diagnosis not present

## 2017-01-13 DIAGNOSIS — Z1389 Encounter for screening for other disorder: Secondary | ICD-10-CM | POA: Diagnosis not present

## 2017-01-13 DIAGNOSIS — E039 Hypothyroidism, unspecified: Secondary | ICD-10-CM | POA: Diagnosis not present

## 2017-01-13 DIAGNOSIS — I639 Cerebral infarction, unspecified: Secondary | ICD-10-CM | POA: Diagnosis not present

## 2017-01-13 DIAGNOSIS — R946 Abnormal results of thyroid function studies: Secondary | ICD-10-CM | POA: Diagnosis not present

## 2017-01-13 DIAGNOSIS — R69 Illness, unspecified: Secondary | ICD-10-CM | POA: Diagnosis not present

## 2017-01-13 DIAGNOSIS — Z79899 Other long term (current) drug therapy: Secondary | ICD-10-CM | POA: Diagnosis not present

## 2017-01-13 DIAGNOSIS — E782 Mixed hyperlipidemia: Secondary | ICD-10-CM | POA: Diagnosis not present

## 2017-01-13 DIAGNOSIS — Z8673 Personal history of transient ischemic attack (TIA), and cerebral infarction without residual deficits: Secondary | ICD-10-CM | POA: Diagnosis not present

## 2017-01-13 DIAGNOSIS — Z681 Body mass index (BMI) 19 or less, adult: Secondary | ICD-10-CM | POA: Diagnosis not present

## 2017-01-14 DIAGNOSIS — S32049A Unspecified fracture of fourth lumbar vertebra, initial encounter for closed fracture: Secondary | ICD-10-CM | POA: Diagnosis not present

## 2017-01-14 DIAGNOSIS — S32040A Wedge compression fracture of fourth lumbar vertebra, initial encounter for closed fracture: Secondary | ICD-10-CM | POA: Diagnosis not present

## 2017-01-29 ENCOUNTER — Other Ambulatory Visit: Payer: Self-pay | Admitting: Neurological Surgery

## 2017-01-29 DIAGNOSIS — S32040A Wedge compression fracture of fourth lumbar vertebra, initial encounter for closed fracture: Secondary | ICD-10-CM

## 2017-02-04 ENCOUNTER — Ambulatory Visit
Admission: RE | Admit: 2017-02-04 | Discharge: 2017-02-04 | Disposition: A | Discharge status: Home / Self Care | Payer: Medicare HMO | Source: Ambulatory Visit | Attending: Neurological Surgery | Admitting: Neurological Surgery

## 2017-02-04 DIAGNOSIS — M5126 Other intervertebral disc displacement, lumbar region: Secondary | ICD-10-CM | POA: Diagnosis not present

## 2017-02-04 DIAGNOSIS — X58XXXA Exposure to other specified factors, initial encounter: Secondary | ICD-10-CM | POA: Diagnosis not present

## 2017-02-04 DIAGNOSIS — S32040A Wedge compression fracture of fourth lumbar vertebra, initial encounter for closed fracture: Secondary | ICD-10-CM | POA: Diagnosis not present

## 2017-02-04 DIAGNOSIS — M47816 Spondylosis without myelopathy or radiculopathy, lumbar region: Secondary | ICD-10-CM | POA: Insufficient documentation

## 2017-02-04 DIAGNOSIS — M48061 Spinal stenosis, lumbar region without neurogenic claudication: Secondary | ICD-10-CM | POA: Diagnosis not present

## 2017-07-11 IMAGING — CT CT HEAD W/O CM
3 of 4 series · 14 of 47 positions shown, 16 images · non-contrast
Comparison: MR head 10/07/2008

CLINICAL DATA: 83-year-old female with a history of fall

EXAM:
CT HEAD WITHOUT CONTRAST
TECHNIQUE: Contiguous axial images were obtained from the base of the skull
through the vertex without intravenous contrast.

[Series 2: head wo · axial · 0.47mm/px · z∈[+112,+237]mm · 8 of 31 slices shown, 10 images]
[im 3/31  brain]
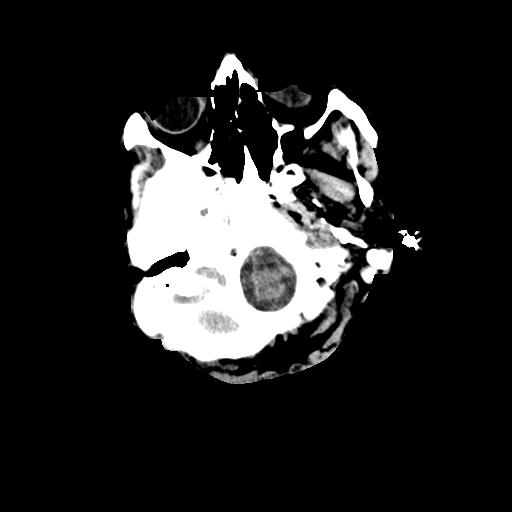
[im 3/31  bone]
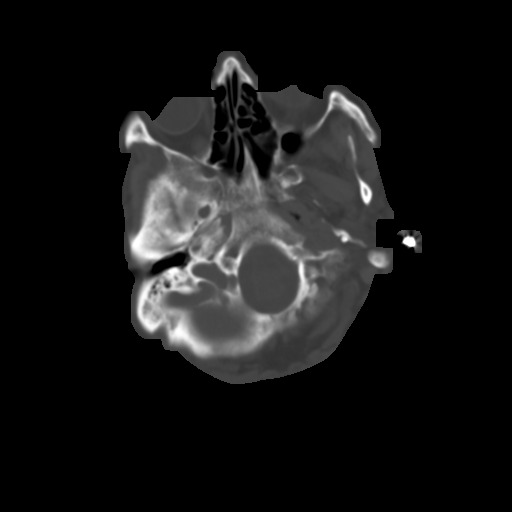
[im 7/31  brain]
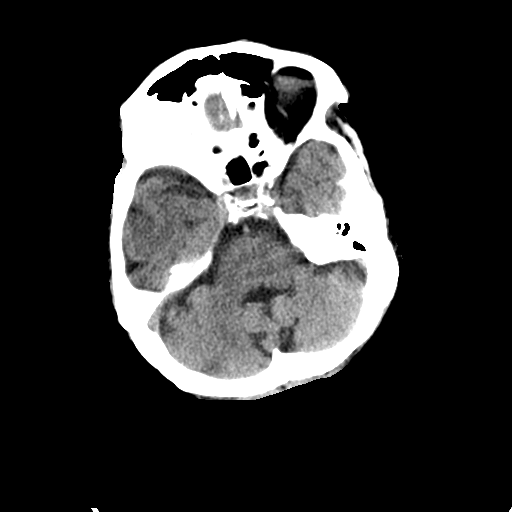
[im 11/31  brain]
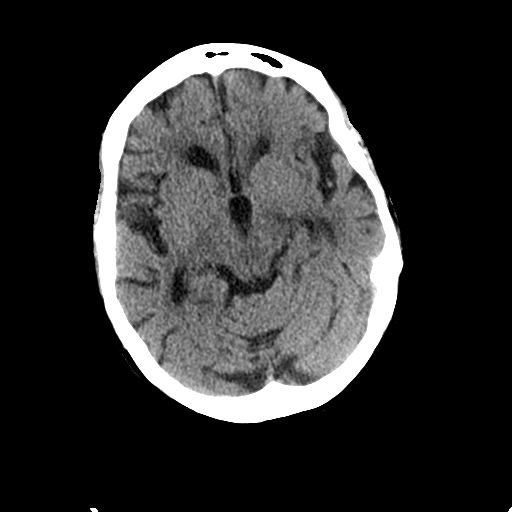
[im 13/31  brain]
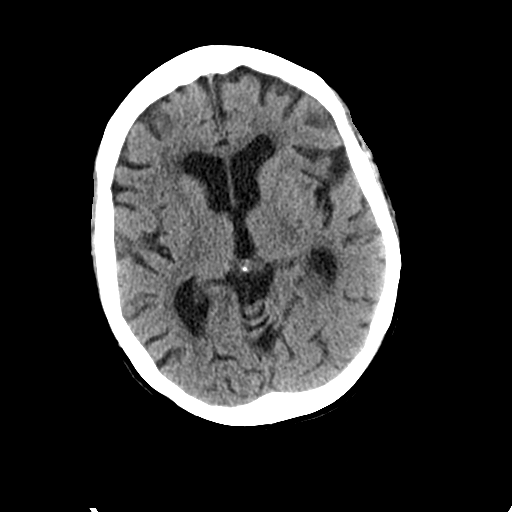
[im 18/31  brain]
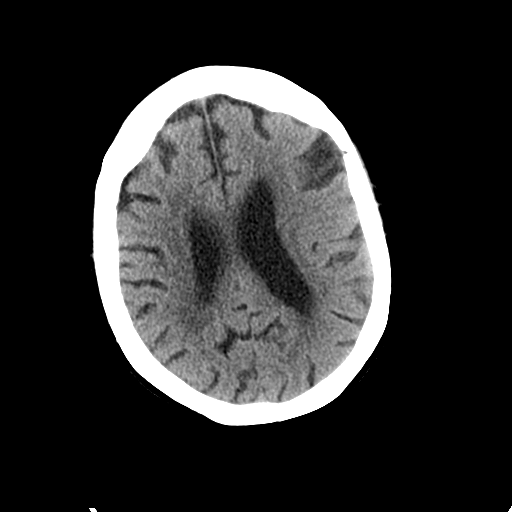
[im 18/31  bone]
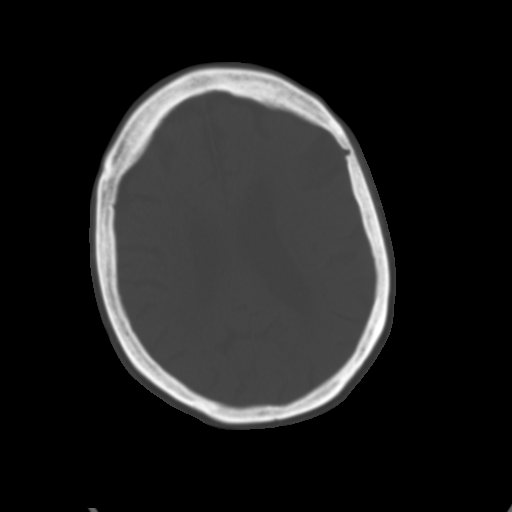
[im 20/31  brain]
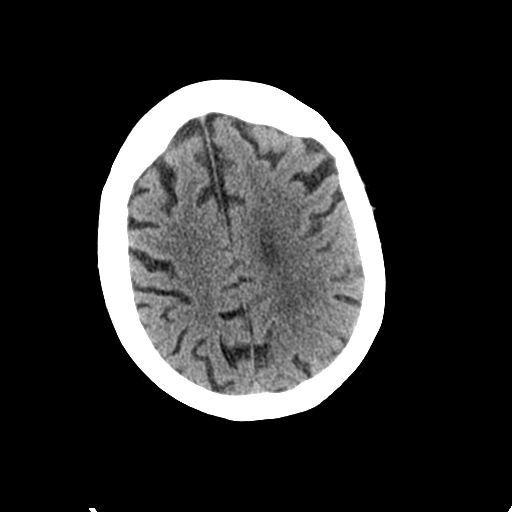
[im 24/31  brain]
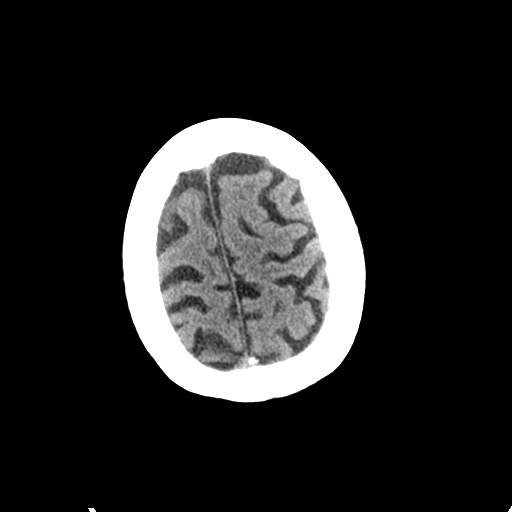
[im 28/31  brain]
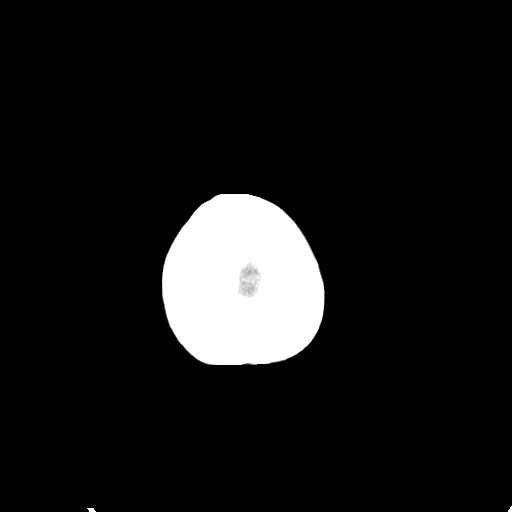

[Series 6: coronal soft tissue · coronal · 0.29mm/px · 3 of 59 slices shown]
[im 20/59  brain]
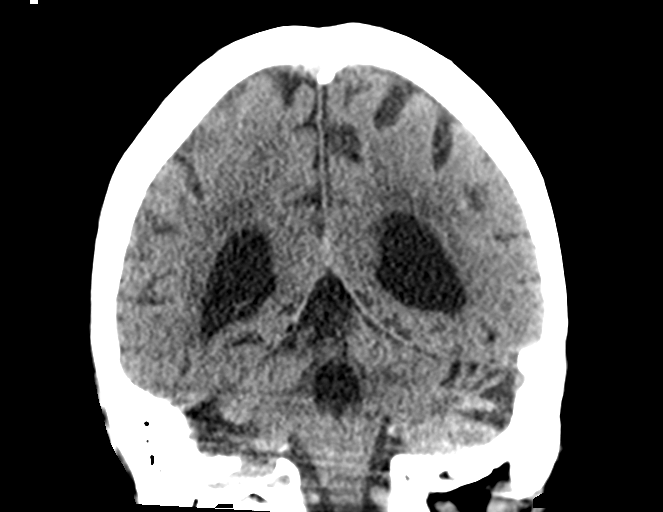
[im 26/59  brain]
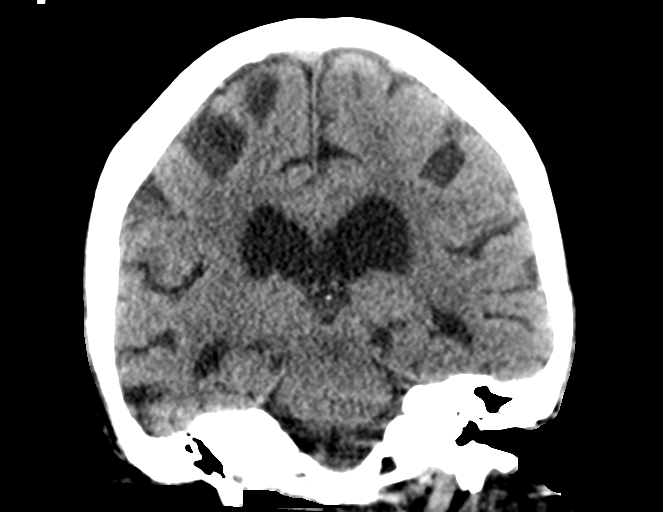
[im 33/59  brain]
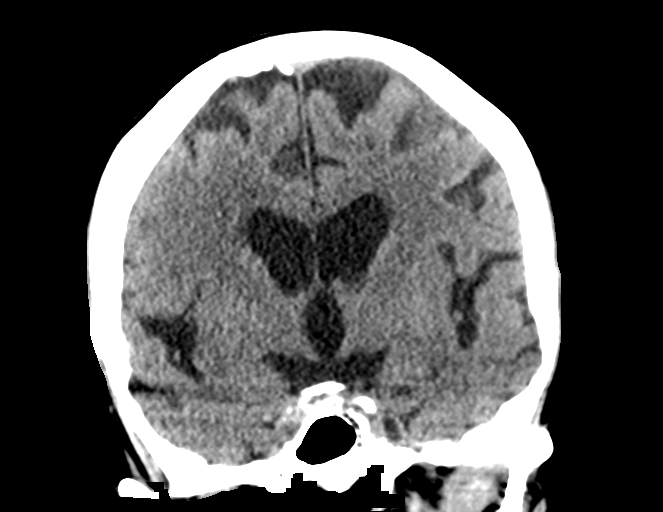

[Series 7: sagittal soft tissue · sagittal · 0.30mm/px · 3 of 47 slices shown]
[im 17/47  brain]
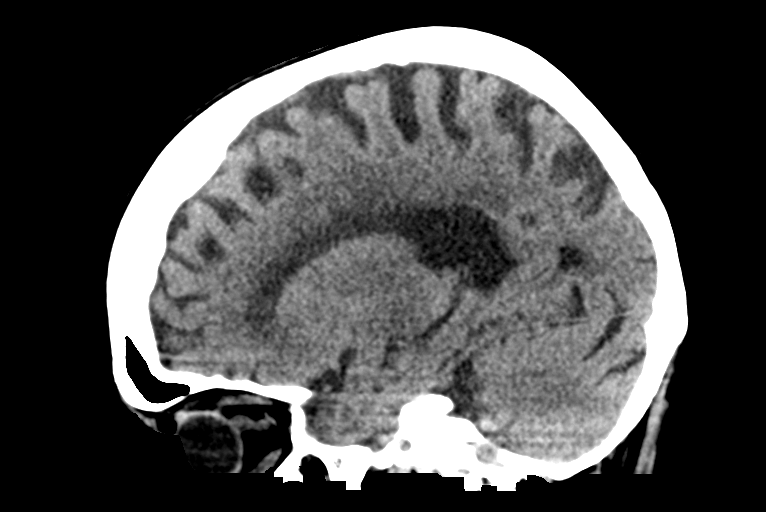
[im 24/47  brain]
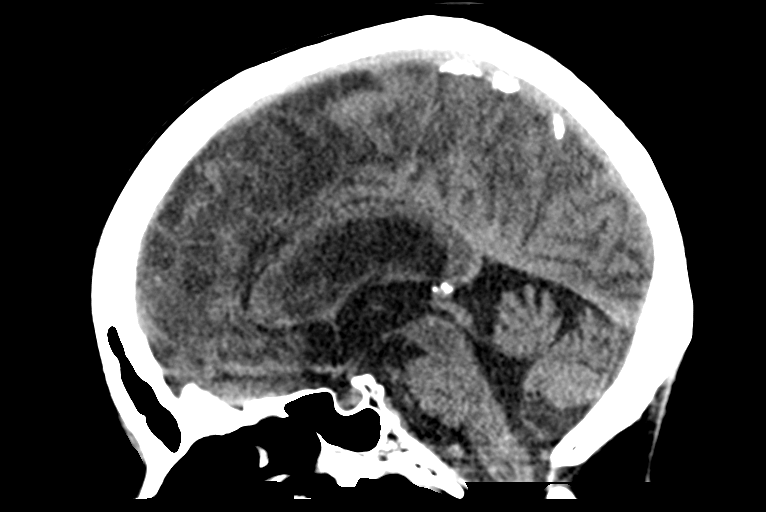
[im 31/47  brain]
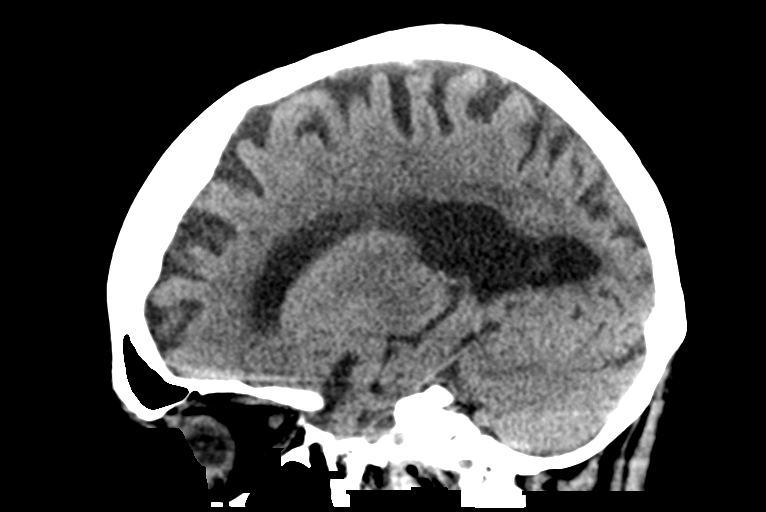

[14 of 47 positions shown; findings below may reference images not displayed]

FINDINGS: Brain: No acute intracranial hemorrhage. No midline shift or mass
effect.

Diffuse brain volume loss. Periventricular white matter hypodensity.
Unremarkable configuration the ventricles.

Vascular: Calcifications of the intracranial vasculature.

Skull: No aggressive bone lesion.  No fracture.

Sinuses/Orbits: Unremarkable

Other: None
IMPRESSION: No CT evidence of acute intracranial abnormality.

Evidence of chronic microvascular ischemic disease with associated
intracranial atherosclerosis.

## 2017-12-05 ENCOUNTER — Other Ambulatory Visit: Payer: Self-pay | Admitting: Neurology

## 2017-12-05 ENCOUNTER — Other Ambulatory Visit (HOSPITAL_COMMUNITY): Payer: Self-pay | Admitting: Neurology

## 2017-12-05 DIAGNOSIS — R413 Other amnesia: Secondary | ICD-10-CM | POA: Diagnosis not present

## 2017-12-05 DIAGNOSIS — E538 Deficiency of other specified B group vitamins: Secondary | ICD-10-CM | POA: Diagnosis not present

## 2017-12-05 DIAGNOSIS — Z8673 Personal history of transient ischemic attack (TIA), and cerebral infarction without residual deficits: Secondary | ICD-10-CM | POA: Diagnosis not present

## 2017-12-05 DIAGNOSIS — Z79899 Other long term (current) drug therapy: Secondary | ICD-10-CM | POA: Diagnosis not present

## 2017-12-12 ENCOUNTER — Ambulatory Visit (HOSPITAL_COMMUNITY)
Admission: RE | Admit: 2017-12-12 | Discharge: 2017-12-12 | Disposition: A | Discharge status: Home / Self Care | Payer: Medicare HMO | Source: Ambulatory Visit | Attending: Neurology | Admitting: Neurology

## 2017-12-12 DIAGNOSIS — R413 Other amnesia: Secondary | ICD-10-CM | POA: Diagnosis not present

## 2017-12-12 DIAGNOSIS — R9389 Abnormal findings on diagnostic imaging of other specified body structures: Secondary | ICD-10-CM | POA: Diagnosis not present

## 2017-12-23 DIAGNOSIS — Z79899 Other long term (current) drug therapy: Secondary | ICD-10-CM | POA: Diagnosis not present

## 2017-12-23 DIAGNOSIS — E538 Deficiency of other specified B group vitamins: Secondary | ICD-10-CM | POA: Diagnosis not present

## 2018-01-20 DIAGNOSIS — Z1389 Encounter for screening for other disorder: Secondary | ICD-10-CM | POA: Diagnosis not present

## 2018-01-20 DIAGNOSIS — D509 Iron deficiency anemia, unspecified: Secondary | ICD-10-CM | POA: Diagnosis not present

## 2018-01-20 DIAGNOSIS — Z681 Body mass index (BMI) 19 or less, adult: Secondary | ICD-10-CM | POA: Diagnosis not present

## 2018-01-20 DIAGNOSIS — E039 Hypothyroidism, unspecified: Secondary | ICD-10-CM | POA: Diagnosis not present

## 2018-01-20 DIAGNOSIS — E782 Mixed hyperlipidemia: Secondary | ICD-10-CM | POA: Diagnosis not present

## 2018-01-20 DIAGNOSIS — Z23 Encounter for immunization: Secondary | ICD-10-CM | POA: Diagnosis not present

## 2018-01-20 DIAGNOSIS — Z0001 Encounter for general adult medical examination with abnormal findings: Secondary | ICD-10-CM | POA: Diagnosis not present

## 2018-04-10 DIAGNOSIS — R413 Other amnesia: Secondary | ICD-10-CM | POA: Diagnosis not present

## 2018-04-10 DIAGNOSIS — Z8673 Personal history of transient ischemic attack (TIA), and cerebral infarction without residual deficits: Secondary | ICD-10-CM | POA: Diagnosis not present

## 2018-09-04 DIAGNOSIS — M25551 Pain in right hip: Secondary | ICD-10-CM | POA: Diagnosis not present

## 2018-09-04 DIAGNOSIS — W010XXA Fall on same level from slipping, tripping and stumbling without subsequent striking against object, initial encounter: Secondary | ICD-10-CM | POA: Diagnosis not present

## 2018-09-04 DIAGNOSIS — S32592A Other specified fracture of left pubis, initial encounter for closed fracture: Secondary | ICD-10-CM | POA: Diagnosis not present

## 2018-09-04 DIAGNOSIS — M7061 Trochanteric bursitis, right hip: Secondary | ICD-10-CM | POA: Diagnosis not present

## 2018-11-27 DIAGNOSIS — H9193 Unspecified hearing loss, bilateral: Secondary | ICD-10-CM | POA: Diagnosis not present

## 2018-11-27 DIAGNOSIS — R2689 Other abnormalities of gait and mobility: Secondary | ICD-10-CM | POA: Diagnosis not present

## 2018-11-27 DIAGNOSIS — R413 Other amnesia: Secondary | ICD-10-CM | POA: Diagnosis not present

## 2018-11-27 DIAGNOSIS — Z8673 Personal history of transient ischemic attack (TIA), and cerebral infarction without residual deficits: Secondary | ICD-10-CM | POA: Diagnosis not present

## 2019-01-26 DIAGNOSIS — Z0001 Encounter for general adult medical examination with abnormal findings: Secondary | ICD-10-CM | POA: Diagnosis not present

## 2019-01-26 DIAGNOSIS — I639 Cerebral infarction, unspecified: Secondary | ICD-10-CM | POA: Diagnosis not present

## 2019-01-26 DIAGNOSIS — Z681 Body mass index (BMI) 19 or less, adult: Secondary | ICD-10-CM | POA: Diagnosis not present

## 2019-01-26 DIAGNOSIS — E7849 Other hyperlipidemia: Secondary | ICD-10-CM | POA: Diagnosis not present

## 2019-01-26 DIAGNOSIS — D509 Iron deficiency anemia, unspecified: Secondary | ICD-10-CM | POA: Diagnosis not present

## 2019-01-26 DIAGNOSIS — Z23 Encounter for immunization: Secondary | ICD-10-CM | POA: Diagnosis not present

## 2019-01-26 DIAGNOSIS — Z1389 Encounter for screening for other disorder: Secondary | ICD-10-CM | POA: Diagnosis not present

## 2019-01-26 DIAGNOSIS — R946 Abnormal results of thyroid function studies: Secondary | ICD-10-CM | POA: Diagnosis not present

## 2019-07-29 ENCOUNTER — Other Ambulatory Visit (HOSPITAL_COMMUNITY): Payer: Self-pay | Admitting: Family Medicine

## 2019-07-29 ENCOUNTER — Other Ambulatory Visit: Payer: Self-pay | Admitting: Family Medicine

## 2019-07-29 DIAGNOSIS — Z681 Body mass index (BMI) 19 or less, adult: Secondary | ICD-10-CM | POA: Diagnosis not present

## 2019-07-29 DIAGNOSIS — E7849 Other hyperlipidemia: Secondary | ICD-10-CM | POA: Diagnosis not present

## 2019-07-29 DIAGNOSIS — I739 Peripheral vascular disease, unspecified: Secondary | ICD-10-CM | POA: Diagnosis not present

## 2019-07-29 DIAGNOSIS — M791 Myalgia, unspecified site: Secondary | ICD-10-CM | POA: Diagnosis not present

## 2019-07-29 DIAGNOSIS — R946 Abnormal results of thyroid function studies: Secondary | ICD-10-CM | POA: Diagnosis not present

## 2019-08-09 ENCOUNTER — Ambulatory Visit (HOSPITAL_COMMUNITY)
Admission: RE | Admit: 2019-08-09 | Discharge: 2019-08-09 | Disposition: A | Discharge status: Home / Self Care | Payer: Medicare Other | Source: Ambulatory Visit | Attending: Family Medicine | Admitting: Family Medicine

## 2019-08-09 ENCOUNTER — Other Ambulatory Visit: Payer: Self-pay

## 2019-08-09 DIAGNOSIS — E785 Hyperlipidemia, unspecified: Secondary | ICD-10-CM | POA: Diagnosis not present

## 2019-08-09 DIAGNOSIS — I739 Peripheral vascular disease, unspecified: Secondary | ICD-10-CM | POA: Insufficient documentation

## 2019-10-26 DIAGNOSIS — Z0120 Encounter for dental examination and cleaning without abnormal findings: Secondary | ICD-10-CM | POA: Diagnosis not present

## 2020-08-16 DIAGNOSIS — Z0000 Encounter for general adult medical examination without abnormal findings: Secondary | ICD-10-CM | POA: Diagnosis not present

## 2020-08-16 DIAGNOSIS — Z8673 Personal history of transient ischemic attack (TIA), and cerebral infarction without residual deficits: Secondary | ICD-10-CM | POA: Diagnosis not present

## 2020-08-16 DIAGNOSIS — Z1389 Encounter for screening for other disorder: Secondary | ICD-10-CM | POA: Diagnosis not present

## 2020-08-16 DIAGNOSIS — Z681 Body mass index (BMI) 19 or less, adult: Secondary | ICD-10-CM | POA: Diagnosis not present

## 2020-08-16 DIAGNOSIS — D509 Iron deficiency anemia, unspecified: Secondary | ICD-10-CM | POA: Diagnosis not present

## 2020-08-16 DIAGNOSIS — R946 Abnormal results of thyroid function studies: Secondary | ICD-10-CM | POA: Diagnosis not present

## 2020-09-22 ENCOUNTER — Encounter (HOSPITAL_COMMUNITY): Payer: Self-pay

## 2020-09-22 ENCOUNTER — Other Ambulatory Visit: Payer: Self-pay

## 2020-09-22 ENCOUNTER — Emergency Department (HOSPITAL_COMMUNITY)
Admission: EM | Admit: 2020-09-22 | Discharge: 2020-09-22 | Disposition: A | Discharge status: Home / Self Care | Payer: Medicare Other | Attending: Emergency Medicine | Admitting: Emergency Medicine

## 2020-09-22 ENCOUNTER — Emergency Department (HOSPITAL_COMMUNITY): Payer: Medicare Other

## 2020-09-22 DIAGNOSIS — R9431 Abnormal electrocardiogram [ECG] [EKG]: Secondary | ICD-10-CM | POA: Diagnosis not present

## 2020-09-22 DIAGNOSIS — Z681 Body mass index (BMI) 19 or less, adult: Secondary | ICD-10-CM | POA: Diagnosis not present

## 2020-09-22 DIAGNOSIS — I639 Cerebral infarction, unspecified: Secondary | ICD-10-CM | POA: Diagnosis not present

## 2020-09-22 DIAGNOSIS — R4182 Altered mental status, unspecified: Secondary | ICD-10-CM | POA: Diagnosis not present

## 2020-09-22 DIAGNOSIS — F0390 Unspecified dementia without behavioral disturbance: Secondary | ICD-10-CM | POA: Diagnosis not present

## 2020-09-22 DIAGNOSIS — F0391 Unspecified dementia with behavioral disturbance: Secondary | ICD-10-CM | POA: Insufficient documentation

## 2020-09-22 DIAGNOSIS — R410 Disorientation, unspecified: Secondary | ICD-10-CM | POA: Diagnosis present

## 2020-09-22 DIAGNOSIS — Z79899 Other long term (current) drug therapy: Secondary | ICD-10-CM | POA: Diagnosis not present

## 2020-09-22 DIAGNOSIS — Z96642 Presence of left artificial hip joint: Secondary | ICD-10-CM | POA: Diagnosis not present

## 2020-09-22 DIAGNOSIS — Z743 Need for continuous supervision: Secondary | ICD-10-CM | POA: Diagnosis not present

## 2020-09-22 HISTORY — DX: Unspecified dementia, unspecified severity, without behavioral disturbance, psychotic disturbance, mood disturbance, and anxiety: F03.90

## 2020-09-22 LAB — CBC WITH DIFFERENTIAL/PLATELET
Abs Immature Granulocytes: 0.02 10*3/uL (ref 0.00–0.07)
Basophils Absolute: 0.1 10*3/uL (ref 0.0–0.1)
Basophils Relative: 1 %
Eosinophils Absolute: 0.1 10*3/uL (ref 0.0–0.5)
Eosinophils Relative: 1 %
HCT: 43 % (ref 36.0–46.0)
Hemoglobin: 13.9 g/dL (ref 12.0–15.0)
Immature Granulocytes: 0 %
Lymphocytes Relative: 27 %
Lymphs Abs: 1.5 10*3/uL (ref 0.7–4.0)
MCH: 33.7 pg (ref 26.0–34.0)
MCHC: 32.3 g/dL (ref 30.0–36.0)
MCV: 104.1 fL — ABNORMAL HIGH (ref 80.0–100.0)
Monocytes Absolute: 0.5 10*3/uL (ref 0.1–1.0)
Monocytes Relative: 9 %
Neutro Abs: 3.6 10*3/uL (ref 1.7–7.7)
Neutrophils Relative %: 62 %
Platelets: 230 10*3/uL (ref 150–400)
RBC: 4.13 MIL/uL (ref 3.87–5.11)
RDW: 13.3 % (ref 11.5–15.5)
WBC: 5.8 10*3/uL (ref 4.0–10.5)
nRBC: 0 % (ref 0.0–0.2)

## 2020-09-22 LAB — URINALYSIS, ROUTINE W REFLEX MICROSCOPIC
Bilirubin Urine: NEGATIVE
Glucose, UA: NEGATIVE mg/dL
Hgb urine dipstick: NEGATIVE
Ketones, ur: NEGATIVE mg/dL
Leukocytes,Ua: NEGATIVE
Nitrite: NEGATIVE
Protein, ur: NEGATIVE mg/dL
Specific Gravity, Urine: 1.025 (ref 1.005–1.030)
pH: 6.5 (ref 5.0–8.0)

## 2020-09-22 LAB — COMPREHENSIVE METABOLIC PANEL
ALT: 12 U/L (ref 0–44)
AST: 18 U/L (ref 15–41)
Albumin: 3.6 g/dL (ref 3.5–5.0)
Alkaline Phosphatase: 24 U/L — ABNORMAL LOW (ref 38–126)
Anion gap: 7 (ref 5–15)
BUN: 23 mg/dL (ref 8–23)
CO2: 26 mmol/L (ref 22–32)
Calcium: 9.1 mg/dL (ref 8.9–10.3)
Chloride: 107 mmol/L (ref 98–111)
Creatinine, Ser: 0.77 mg/dL (ref 0.44–1.00)
GFR, Estimated: >60 mL/min (ref >60)
Glucose, Bld: 106 mg/dL — ABNORMAL HIGH (ref 70–99)
Potassium: 4.1 mmol/L (ref 3.5–5.1)
Sodium: 140 mmol/L (ref 135–145)
Total Bilirubin: 0.6 mg/dL (ref 0.3–1.2)
Total Protein: 6 g/dL — ABNORMAL LOW (ref 6.5–8.1)

## 2020-09-22 LAB — TROPONIN I (HIGH SENSITIVITY): Troponin I (High Sensitivity): 6 ng/L (ref <18)

## 2020-09-22 NOTE — ED Provider Notes (Signed)
[facility] EMERGENCY DEPARTMENT Provider Note  CSN: [id number] Arrival date & time: 09/22/20 1009    History No chief complaint on file.   HPI  [name] is a 87 y.o. female with history of dementia brought to the ED by daughter who is at bedside. She reports the patient has had a gradual decline for the last several years but has had a sudden worsening in the last 2 weeks with agitation, hallucination and thinking her husband is a stranger. Husband and family are having trouble managing her symptoms. She was seen at PCP office today and sent to the ED for evaluation. Daughter reports family is interested in LTCF/memory care. Patient denies any complaints but is not able to give any clear history. Level 5 caveat applies.    Past Medical History:  Diagnosis Date   Arthritis    Dementia ([place])    High cholesterol 02/29/2016   Stroke [facility])    TIA (transient ischemic attack) 02/29/2016    Past Surgical History:  Procedure Laterality Date   APPENDECTOMY     CATARACT EXTRACTION W/PHACO Right 11/22/2014   Procedure: CATARACT EXTRACTION PHACO AND INTRAOCULAR LENS PLACEMENT ([place]);  Surgeon: Rutherford Guys, MD;  Location: [facility] ORS;  Service: Ophthalmology;  Laterality: Right;  CDE:5.05   CATARACT EXTRACTION W/PHACO Left 12/13/2014   Procedure: CATARACT EXTRACTION PHACO AND INTRAOCULAR LENS PLACEMENT (IOC);  Surgeon: Rutherford Guys, MD;  Location: [facility] ORS;  Service: Ophthalmology;  Laterality: Left;  CDE:7.17   COLONOSCOPY N/A 03/11/2016   Procedure: COLONOSCOPY;  Surgeon: Rogene Houston, MD;  Location: [facility] ENDO SUITE;  Service: Endoscopy;  Laterality: N/A;  7:30   ESOPHAGOGASTRODUODENOSCOPY N/A 03/11/2016   Procedure: ESOPHAGOGASTRODUODENOSCOPY (EGD);  Surgeon: Rogene Houston, MD;  Location: [facility] ENDO SUITE;  Service: Endoscopy;  Laterality: N/A;   HIP ARTHROPLASTY Left 08/09/2016   Procedure: ARTHROPLASTY BIPOLAR HIP (HEMIARTHROPLASTY);  Surgeon: Dereck Leep, MD;  Location: [facility] ORS;   Service: Orthopedics;  Laterality: Left;    No family history on file.  Social History   Tobacco Use   Smoking status: Never   Smokeless tobacco: Never  Substance Use Topics   Alcohol use: No   Drug use: No     Home Medications Prior to Admission medications   Medication Sig Start Date End Date Taking? Authorizing Provider  dipyridamole-aspirin (AGGRENOX) 200-25 MG 12hr capsule Take 1 capsule by mouth 2 (two) times daily. 03/12/16  Yes Rehman, Mechele Dawley, MD  donepezil (ARICEPT) 5 MG tablet Take 5 mg by mouth at bedtime. 07/27/20  Yes [provider]  rosuvastatin (CRESTOR) 5 MG tablet Take 5 mg by mouth daily. 08/11/20  Yes [provider]  bismuth-metronidazole-tetracycline (PYLERA) 140-125-125 MG capsule Take 3 capsules by mouth 4 (four) times daily -  before meals and at bedtime. Patient not taking: No sig reported 03/15/16   Rogene Houston, MD  enoxaparin (LOVENOX) 40 MG/0.4ML injection Inject 0.4 mLs (40 mg total) into the skin daily. Patient not taking: Reported on 09/22/2020 08/12/16   Fritzi Mandes, MD  ferrous sulfate 325 (65 FE) MG tablet Take 1 tablet (325 mg total) by mouth 2 (two) times daily with a meal. Patient not taking: Reported on 09/22/2020 08/11/16   Fritzi Mandes, MD  HYDROcodone-acetaminophen (NORCO/VICODIN) 5-325 MG tablet Take 1 tablet by mouth every 4 (four) hours as needed for moderate pain. Patient not taking: No sig reported 12/19/16   Cuthriell, Charline Bills, PA-C  methocarbamol (ROBAXIN) 500 MG tablet Take 1 tablet (500 mg  total) by mouth 4 (four) times daily. Patient not taking: No sig reported 12/19/16   Cuthriell, Charline Bills, PA-C  predniSONE (DELTASONE) 50 MG tablet Take 1 tablet (50 mg total) by mouth daily with breakfast. Patient not taking: No sig reported 12/19/16   Cuthriell, Charline Bills, PA-C  traMADol (ULTRAM) 50 MG tablet Take 1 tablet (50 mg total) by mouth every 8 (eight) hours as needed for moderate pain. Patient not taking: No sig  reported 08/11/16   Fritzi Mandes, MD     Allergies    Patient has no known allergies.   Review of Systems   Review of Systems Unable to assess due to mental status.    Physical Exam BP (!) 133/58   Pulse 72   Temp 97.9 F (36.6 C) (Oral)   Resp 16   Ht 5\' 4"  (1.626 m)   Wt 43.1 kg   SpO2 100%   BMI 16.31 kg/m   Physical Exam Vitals and nursing note reviewed.  Constitutional:      Appearance: Normal appearance.  HENT:     Head: Normocephalic and atraumatic.     Nose: Nose normal.     Mouth/Throat:     Mouth: Mucous membranes are moist.  Eyes:     Extraocular Movements: Extraocular movements intact.     Conjunctiva/sclera: Conjunctivae normal.  Cardiovascular:     Rate and Rhythm: Normal rate.  Pulmonary:     Effort: Pulmonary effort is normal.     Breath sounds: Normal breath sounds.  Abdominal:     General: Abdomen is flat.     Palpations: Abdomen is soft.     Tenderness: There is no abdominal tenderness.  Musculoskeletal:        General: No swelling. Normal range of motion.     Cervical back: Neck supple.  Skin:    General: Skin is warm and dry.  Neurological:     General: No focal deficit present.     Mental Status: She is alert. Mental status is at baseline. She is disoriented.  Psychiatric:        Mood and Affect: Mood normal.     ED Results / Procedures / Treatments   Labs (all labs ordered are listed, but only abnormal results are displayed) Labs Reviewed  CBC WITH DIFFERENTIAL/PLATELET - Abnormal; Notable for the following components:      Result Value   MCV 104.1 (*)    All other components within normal limits  COMPREHENSIVE METABOLIC PANEL - Abnormal; Notable for the following components:   Glucose, Bld 106 (*)    Total Protein 6.0 (*)    Alkaline Phosphatase 24 (*)    All other components within normal limits  URINALYSIS, ROUTINE W REFLEX MICROSCOPIC  TROPONIN I (HIGH SENSITIVITY)    EKG EKG Interpretation  Date/Time:  Friday  September 22 2020 11:29:59 EDT Ventricular Rate:  72 PR Interval:  115 QRS Duration: 84 QT Interval:  420 QTC Calculation: 460 R Axis:   86 Text Interpretation: Sinus rhythm Borderline short PR interval Borderline right axis deviation Minimal ST depression, diffuse leads Baseline wander in lead(s) V4 No significant change since last tracing Confirmed by Calvert Cantor [phone]) on 09/22/2020 11:34:08 AM  Radiology CT Head Wo Contrast  Result Date: 09/22/2020 CLINICAL DATA:  Altered mental status.  Advanced dementia. EXAM: CT HEAD WITHOUT CONTRAST TECHNIQUE: Contiguous axial images were obtained from the base of the skull through the vertex without intravenous contrast. COMPARISON:  08/08/2016 FINDINGS: Brain: Stable moderately enlarged  ventricles and subarachnoid spaces. Mild-to-moderate patchy white matter low density in both cerebral hemispheres without significant change. No intracranial hemorrhage, mass lesion or CT evidence of acute infarction. Vascular: No hyperdense vessel or unexpected calcification. Skull: Normal. Negative for fracture or focal lesion. Sinuses/Orbits: Status post bilateral cataract extraction. Unremarkable bones and included paranasal sinuses. Other: Deviation of the midportion of the nasal septum to the right. IMPRESSION: 1. No acute abnormality. 2. Stable atrophy and chronic small vessel white matter ischemic changes. Electronically Signed   By: Claudie Revering M.D.   On: 09/22/2020 12:30    Procedures Procedures  Medications Ordered in the ED Medications - No data to display   MDM Rules/Calculators/A&P MDM  Patient with dementia and worsening behaviors at home. Discussed with daughter that we will start with a medical workup to evaluate for an acute condition which may have exacerbated her symptoms. If none is found, she understands that they will need to begin the process of getting her placed into a LTCF but that it may not be possible to complete that during an ED  visit.   ED Course  I have reviewed the triage vital signs and the nursing notes.  Pertinent labs & imaging results that were available during my care of the patient were reviewed by me and considered in my medical decision making (see chart for details).  Clinical Course as of 09/22/20 1359  Fri Sep 22, 2020  1133 CBC is normal.  [CS]  1027 CMP is neg.  [CS]  2536 Trop is neg.  [CS]  6440 CT head is neg for acute process.  [CS]  3474 UA is negative. Patient is eager to go home. Daughter has spoken with SW who has given some advice on beginning process of LTCF placement.  [CS]    Clinical Course User Index [CS] Truddie Hidden, MD    Final Clinical Impression(s) / ED Diagnoses Final diagnoses:  Dementia with behavioral disturbance, unspecified dementia type [facility])    Rx / DC Orders ED Discharge Orders     None        Truddie Hidden, MD 09/22/20 1359

## 2020-09-22 NOTE — Progress Notes (Signed)
CSW spoke with pts daughter in ED. CSW informed Ms. Madilyn Fireman that we cannot place from the ED. CSW informed that CSW could provide resources for memory care or ALF placements they could look into from the community. Ms. Madilyn Fireman was not interested in this. She is also not interested in [facility] services as pt will likely not want anyone in her home. CSW explained that it may be beneficial to reach out to DSS to inquire about possible assistance pt could qualify for. She is understanding. TOC signing off.

## 2020-09-22 NOTE — ED Triage Notes (Signed)
Daughter reports pt has advanced dementia and after lunch every day, she said she goes into "rages."  Daughter says she hallucinates and thinks her husband is a stranger.  Dr. Hilma Favors sent pt here for evaluation.  PCP and daughter are concerned for pt's safety and the safety of her spouse.

## 2020-09-25 DIAGNOSIS — Z111 Encounter for screening for respiratory tuberculosis: Secondary | ICD-10-CM | POA: Diagnosis not present

## 2020-10-11 DIAGNOSIS — D649 Anemia, unspecified: Secondary | ICD-10-CM | POA: Diagnosis not present

## 2020-10-11 DIAGNOSIS — F32A Depression, unspecified: Secondary | ICD-10-CM | POA: Diagnosis not present

## 2020-10-11 DIAGNOSIS — F0280 Dementia in other diseases classified elsewhere without behavioral disturbance: Secondary | ICD-10-CM | POA: Diagnosis not present

## 2020-10-11 DIAGNOSIS — E039 Hypothyroidism, unspecified: Secondary | ICD-10-CM | POA: Diagnosis not present

## 2020-10-18 DIAGNOSIS — E039 Hypothyroidism, unspecified: Secondary | ICD-10-CM | POA: Diagnosis not present

## 2020-10-18 DIAGNOSIS — D649 Anemia, unspecified: Secondary | ICD-10-CM | POA: Diagnosis not present

## 2020-10-18 DIAGNOSIS — G309 Alzheimer's disease, unspecified: Secondary | ICD-10-CM | POA: Diagnosis not present

## 2020-10-18 DIAGNOSIS — F0150 Vascular dementia without behavioral disturbance: Secondary | ICD-10-CM | POA: Diagnosis not present

## 2020-10-25 DIAGNOSIS — E039 Hypothyroidism, unspecified: Secondary | ICD-10-CM | POA: Diagnosis not present

## 2020-10-25 DIAGNOSIS — F0150 Vascular dementia without behavioral disturbance: Secondary | ICD-10-CM | POA: Diagnosis not present

## 2020-10-25 DIAGNOSIS — G309 Alzheimer's disease, unspecified: Secondary | ICD-10-CM | POA: Diagnosis not present

## 2020-10-25 DIAGNOSIS — D649 Anemia, unspecified: Secondary | ICD-10-CM | POA: Diagnosis not present

## 2020-11-01 DIAGNOSIS — M79604 Pain in right leg: Secondary | ICD-10-CM | POA: Diagnosis not present

## 2020-11-01 DIAGNOSIS — F0150 Vascular dementia without behavioral disturbance: Secondary | ICD-10-CM | POA: Diagnosis not present

## 2020-11-01 DIAGNOSIS — G309 Alzheimer's disease, unspecified: Secondary | ICD-10-CM | POA: Diagnosis not present

## 2020-11-01 DIAGNOSIS — M79605 Pain in left leg: Secondary | ICD-10-CM | POA: Diagnosis not present

## 2020-11-19 ENCOUNTER — Emergency Department (HOSPITAL_COMMUNITY)
Admission: EM | Admit: 2020-11-19 | Discharge: 2020-11-20 | Disposition: A | Discharge status: Home / Self Care | Payer: Medicare Other | Attending: Emergency Medicine | Admitting: Emergency Medicine

## 2020-11-19 DIAGNOSIS — S060X9A Concussion with loss of consciousness of unspecified duration, initial encounter: Secondary | ICD-10-CM

## 2020-11-19 DIAGNOSIS — W1839XA Other fall on same level, initial encounter: Secondary | ICD-10-CM | POA: Diagnosis not present

## 2020-11-19 DIAGNOSIS — F0390 Unspecified dementia without behavioral disturbance: Secondary | ICD-10-CM | POA: Insufficient documentation

## 2020-11-19 DIAGNOSIS — R9431 Abnormal electrocardiogram [ECG] [EKG]: Secondary | ICD-10-CM | POA: Diagnosis not present

## 2020-11-19 DIAGNOSIS — Z96642 Presence of left artificial hip joint: Secondary | ICD-10-CM | POA: Insufficient documentation

## 2020-11-19 DIAGNOSIS — Y92009 Unspecified place in unspecified non-institutional (private) residence as the place of occurrence of the external cause: Secondary | ICD-10-CM | POA: Insufficient documentation

## 2020-11-19 DIAGNOSIS — W19XXXA Unspecified fall, initial encounter: Secondary | ICD-10-CM | POA: Diagnosis not present

## 2020-11-19 DIAGNOSIS — S0003XA Contusion of scalp, initial encounter: Secondary | ICD-10-CM

## 2020-11-19 DIAGNOSIS — I10 Essential (primary) hypertension: Secondary | ICD-10-CM | POA: Diagnosis not present

## 2020-11-19 DIAGNOSIS — S0990XA Unspecified injury of head, initial encounter: Secondary | ICD-10-CM | POA: Diagnosis present

## 2020-11-19 DIAGNOSIS — R58 Hemorrhage, not elsewhere classified: Secondary | ICD-10-CM | POA: Diagnosis not present
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
## 2020-11-20 ENCOUNTER — Encounter (HOSPITAL_COMMUNITY): Payer: Self-pay

## 2020-11-20 ENCOUNTER — Emergency Department (HOSPITAL_COMMUNITY): Payer: Medicare Other

## 2020-11-20 DIAGNOSIS — R9431 Abnormal electrocardiogram [ECG] [EKG]: Secondary | ICD-10-CM | POA: Diagnosis not present

## 2020-11-20 DIAGNOSIS — S060X9A Concussion with loss of consciousness of unspecified duration, initial encounter: Secondary | ICD-10-CM | POA: Diagnosis not present

## 2020-11-20 DIAGNOSIS — S0003XA Contusion of scalp, initial encounter: Secondary | ICD-10-CM | POA: Diagnosis not present

## 2020-11-20 NOTE — ED Notes (Signed)
Family unable to take pt back to [facility]. Placed on ems list.

## 2020-11-20 NOTE — ED Provider Notes (Signed)
[facility] EMERGENCY DEPARTMENT Provider Note   CSN: [id number] Arrival date & time: 11/19/20  2352     History Chief Complaint  Patient presents with   Fall   Level 5 caveat due to dementia [name] is a 87 y.o. female.  The history is provided by the patient. The history is limited by the condition of the patient.  Fall This is a new problem. Nothing aggravates the symptoms. Nothing relieves the symptoms.  Patient history of dementia presents with fall.  She presents for nursing facility.  Patient found on the floor.  No other details are known.  Patient denies any complaints - denies any neck or back pain.  Denies headache     Past Medical History:  Diagnosis Date   Arthritis    Dementia ([place])    High cholesterol 02/29/2016   Stroke ([place])    TIA (transient ischemic attack) 02/29/2016    Patient Active Problem List   Diagnosis Date Noted   Hip fracture ([place]) 08/08/2016   Melena 03/05/2016   Absolute anemia 03/05/2016   TIA (transient ischemic attack) 02/29/2016   High cholesterol 02/29/2016    Past Surgical History:  Procedure Laterality Date   APPENDECTOMY     CATARACT EXTRACTION W/PHACO Right 11/22/2014   Procedure: CATARACT EXTRACTION PHACO AND INTRAOCULAR LENS PLACEMENT ([place]);  Surgeon: Rutherford Guys, MD;  Location: [facility] ORS;  Service: Ophthalmology;  Laterality: Right;  CDE:5.05   CATARACT EXTRACTION W/PHACO Left 12/13/2014   Procedure: CATARACT EXTRACTION PHACO AND INTRAOCULAR LENS PLACEMENT (IOC);  Surgeon: Rutherford Guys, MD;  Location: [facility] ORS;  Service: Ophthalmology;  Laterality: Left;  CDE:7.17   COLONOSCOPY N/A 03/11/2016   Procedure: COLONOSCOPY;  Surgeon: Rogene Houston, MD;  Location: [facility] END[facility];  Service: Endoscopy;  Laterality: N/A;  7:30   ESOPHAGOGASTRODUODENOSCOPY N/A 03/11/2016   Procedure: ESOPHAGOGASTRODUODENOSCOPY (EGD);  Surgeon: Rogene Houston, MD;  Location: [facility];  Service: Endoscopy;  Laterality: N/A;   HIP ARTHROPLASTY  Left 08/09/2016   Procedure: ARTHROPLASTY BIPOLAR HIP (HEMIARTHROPLASTY);  Surgeon: Dereck Leep, MD;  Location: [facility] ORS;  Service: Orthopedics;  Laterality: Left;     OB History   No obstetric history on file.     History reviewed. No pertinent family history.  Social History   Tobacco Use   Smoking status: Never   Smokeless tobacco: Never  Substance Use Topics   Alcohol use: No   Drug use: No    Home Medications Prior to Admission medications   Medication Sig Start Date End Date Taking? Authorizing Provider  dipyridamole-aspirin (AGGRENOX) 200-25 MG 12hr capsule Take 1 capsule by mouth 2 (two) times daily. 03/12/16   Rehman, Mechele Dawley, MD  donepezil (ARICEPT) 5 MG tablet Take 5 mg by mouth at bedtime. 07/27/20   [provider]  rosuvastatin (CRESTOR) 5 MG tablet Take 5 mg by mouth daily. 08/11/20   [provider]  traMADol (ULTRAM) 50 MG tablet Take 1 tablet (50 mg total) by mouth every 8 (eight) hours as needed for moderate pain. Patient not taking: No sig reported 08/11/16   Fritzi Mandes, MD    Allergies    Patient has no known allergies.  Review of Systems   Review of Systems  Unable to perform ROS: Dementia   Physical Exam Updated Vital Signs BP (!) 164/78   Pulse 78   Temp 98 F (36.7 C) (Oral)   Resp 17   Ht 1.626 m ('5\' 4"'$ )   Wt 43.1 kg  SpO2 95%   BMI 16.31 kg/m   Physical Exam CONSTITUTIONAL: Elderly, no acute distress HEAD: Small abrasion/hematoma noted to scalp.  No lacerations.  No other signs of trauma EYES: EOMI/PERRL ENMT: Mucous membranes moist, no visible trauma NECK: supple no meningeal signs SPINE/BACK:entire spine nontender, no bruising/crepitance/stepoffs noted to spine CV: S1/S2 noted LUNGS: Lungs are clear to auscultation bilaterally, no apparent distress Chest-no bruising or tenderness noted ABDOMEN: soft, nontender NEURO: Pt is awake/alert,moves all extremitiesx4.  No facial droop.  Patient is pleasantly  confused EXTREMITIES: pulses normal/equal, full ROM, pelvis stable. All other extremities/joints palpated/ranged and nontender SKIN: warm, color normal   ED Results / Procedures / Treatments   Labs (all labs ordered are listed, but only abnormal results are displayed) Labs Reviewed - No data to display  EKG EKG Interpretation  Date/Time:  Monday November 20 2020 00:02:51 EDT Ventricular Rate:  78 PR Interval:  128 QRS Duration: 76 QT Interval:  391 QTC Calculation: 446 R Axis:   75 Text Interpretation: Sinus rhythm Atrial premature complex Baseline wander in lead(s) V3 V4 V5 Confirmed by Ripley Fraise [phone]) on 11/20/2020 12:12:14 AM  Radiology CT HEAD WO CONTRAST (5MM)  Result Date: 11/20/2020 CLINICAL DATA:  Head trauma. EXAM: CT HEAD WITHOUT CONTRAST TECHNIQUE: Contiguous axial images were obtained from the base of the skull through the vertex without intravenous contrast. COMPARISON:  Head CT dated 09/22/2020. FINDINGS: Brain: Mild age-related atrophy and chronic microvascular ischemic changes. There is no acute intracranial hemorrhage. No mass effect or midline shift. No extra-axial fluid collection. Vascular: No hyperdense vessel or unexpected calcification. Skull: Normal. Negative for fracture or focal lesion. Sinuses/Orbits: No acute finding. Other: Left posterior parietal scalp contusion. IMPRESSION: 1. No acute intracranial pathology. 2. Mild age-related atrophy and chronic microvascular ischemic changes. Electronically Signed   By: Anner Crete M.D.   On: 11/20/2020 01:34    Procedures Procedures   Medications Ordered in ED Medications - No data to display  ED Course  I have reviewed the triage vital signs and the nursing notes.  Pertinent  imaging results that were available during my care of the patient were reviewed by me and considered in my medical decision making (see chart for details).    MDM Rules/Calculators/A&P                           Patient  presents for fall of unclear etiology.  Patient poor historian.  EMS reports they were unable to get any report from the nursing home.  Patient was just found on the floor with evidence of a hematoma to her scalp When I review her records, it does appear that she is on Aggrenox. No other anticoagulation Will obtain CT head. 1:46 AM CT head negative Patient denies any complaints.  She can stand without difficulty.  Will discharge back to facility once a ride is available Final Clinical Impression(s) / ED Diagnoses Final diagnoses:  Fall, initial encounter  Contusion of scalp, initial encounter  Concussion with loss of consciousness, initial encounter    Rx / DC Orders ED Discharge Orders     None        Ripley Fraise, MD 11/20/20 [phone]

## 2020-11-20 NOTE — ED Notes (Signed)
Pt able to stand and walk to restroom with standby assist

## 2020-11-22 DIAGNOSIS — G309 Alzheimer's disease, unspecified: Secondary | ICD-10-CM | POA: Diagnosis not present

## 2020-11-22 DIAGNOSIS — W19XXXA Unspecified fall, initial encounter: Secondary | ICD-10-CM | POA: Diagnosis not present

## 2020-11-22 DIAGNOSIS — S0181XA Laceration without foreign body of other part of head, initial encounter: Secondary | ICD-10-CM | POA: Diagnosis not present

## 2020-11-22 DIAGNOSIS — F0150 Vascular dementia without behavioral disturbance: Secondary | ICD-10-CM | POA: Diagnosis not present

## 2020-12-08 DIAGNOSIS — Z79899 Other long term (current) drug therapy: Secondary | ICD-10-CM | POA: Diagnosis not present

## 2021-01-31 DIAGNOSIS — D649 Anemia, unspecified: Secondary | ICD-10-CM | POA: Diagnosis not present

## 2021-01-31 DIAGNOSIS — E039 Hypothyroidism, unspecified: Secondary | ICD-10-CM | POA: Diagnosis not present

## 2021-01-31 DIAGNOSIS — G309 Alzheimer's disease, unspecified: Secondary | ICD-10-CM | POA: Diagnosis not present

## 2021-01-31 DIAGNOSIS — F0150 Vascular dementia without behavioral disturbance: Secondary | ICD-10-CM | POA: Diagnosis not present

## 2021-02-09 ENCOUNTER — Encounter: Payer: Self-pay | Admitting: Nurse Practitioner

## 2021-02-09 ENCOUNTER — Non-Acute Institutional Stay: Payer: Medicare Other | Admitting: Nurse Practitioner

## 2021-02-09 ENCOUNTER — Other Ambulatory Visit: Payer: Self-pay

## 2021-02-09 DIAGNOSIS — R630 Anorexia: Secondary | ICD-10-CM

## 2021-02-09 DIAGNOSIS — F03911 Unspecified dementia, unspecified severity, with agitation: Secondary | ICD-10-CM

## 2021-02-09 DIAGNOSIS — Z515 Encounter for palliative care: Secondary | ICD-10-CM

## 2021-02-09 DIAGNOSIS — F02818 Dementia in other diseases classified elsewhere, unspecified severity, with other behavioral disturbance: Secondary | ICD-10-CM

## 2021-02-09 NOTE — Progress Notes (Addendum)
[name]: [phone]  Fax: [phone]   Date of encounter: 02/09/21 8:18 PM PATIENT NAME: [name] [address] [name] [place] [address]   [phone] (home)  DOB: 02-24-1933 MRN: [id number] PRIMARY CARE PROVIDER:   [facility] Elaine Sites, MD,  [address] [place] [address] [phone]  REFERRING PROVIDER:   [facility]  RESPONSIBLE PARTY:    Contact Information     Name Relation Home Work [name] [place] Potter [phone]     [name] [phone]     [name]   [phone]      I met face to face with patient and family in [place]. Palliative Care was asked to follow this patient by consultation request of  [name]/[name], John, MD to address advance care planning and complex medical decision making. This is the initial visit.                             ASSESSMENT AND PLAN / RECOMMENDATIONS:  Symptom Management/Plan: 1. Advance Care Planning; DNR; Discussed goals with 2 daughters; reviewed Hospice eligibility under Medicare benefit; discussed will review with Hospice Physician for eligibility  2. 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 Will continue to encourage to eat as she does feed herself. Takes prompting to get her to eat as she is easily distracted. Recommended baseline labs to be done to see about albumin/total protein with muscle wasting appears protein calorie malnutrition.  3. Agitation secondary to dementia; currently on sertraline, discussed at length with daughters [name] and [name] concern of hyperactivity. Noted she does drink a large coffee brought to her daily by her husband. Recommended Psychiatry to follow, [name] and [name] in agreement. May benefit from seroquel with hallucinations  visual with auditory in addition to hyperactivity, very distracted.  4. Goals of Care: Goals include to maximize quality of life and symptom management. Our advance care planning conversation included a discussion about:    The value and importance of advance care planning  Exploration of personal, cultural or spiritual beliefs that might influence medical decisions  Exploration of goals of care in the event of a sudden injury or illness  Identification and preparation of a healthcare agent  Review and updating or creation of an advance directive document.  5. Palliative care encounter; Palliative care encounter; Palliative medicine team will continue to support patient, patient's family, and medical team. Visit consisted of counseling and education dealing with the complex and emotionally intense issues of symptom management and palliative care in the setting of serious and potentially life-threatening illness  6. f/u 1 month for ongoing monitoring chronic disease progression, ongoing discussions complex medical decision making  Follow up Palliative Care Visit: Palliative care will continue to follow for complex medical decision making, advance care planning, and clarification of goals. Return 4 weeks or prn.  I spent 77 minutes providing this consultation. More than 50% of the time in this consultation was spent in counseling and care coordination. PPS: 40%  Chief Complaint: Initial palliative consult for complex medical decision making  HISTORY OF PRESENT ILLNESS:  [name] is a 87 y.o. year old female  with multiple medical problems including dementia with behaviors, TIA's, Stroke, arthritis, HLD, anemia, h/o hip fx. Staff endorses [name] does reside at [facility]. Elaine. Potter does ambulate with no recent falls. Elaine. Potter does require family/staff to perform her bath. Staff does have to assist with prompting, instructions to get dressed to moderate  assistance. Elaine. Potter has episodes of incontinence. Elaine. Potter does feed herself. Decreased appetite with anorexia secondary to dementia has been on Remeron but ?d/c need to clarify; with current weight 100.2 lbs, per daughters slight weight gain. Takes prompting by staff get her to eat as she is easily distracted. No wounds, recent infections, hospitalizations. Staff endorses Elaine. Potter is very interactive with staff, other residence. Elaine. Potter does have times of agitation, currently on sertraline. At present, Elaine. Potter is walking in the hall with her family, 2 daughters [name] and [name] with husband. Elaine. Potter and [name] went to the dining area to eat while talked with [name] and [name] separately. We talked about past medical history, last time she was independent at home. Less than 1 year ago she was driving, fully independent. Cognitive changes rapidly changed, hallucinations visual with auditory. Elaine. Potter was not eating, difficulty performing adl's, making it more difficult for [name] to care for her at home. Elaine. Potter recently moved to [facility]ed [facility] Unit where she currently resides. We talked about her functional decline as she is able to ambulate. Requires assistance for bathing, [name] endorses family bathes her. We talked about symptoms, currently concerns of hyperactive intermit behaviors. We talked about Elaine. Potter was coming daily, spending >8 hrs which the daughters stopped that as it was more difficult for him with his health. We talked about behaviors, sleep, appetite. We talked about Elaine. Potter feeding herself. We talked about declined appetite with weight loss, though gained about 4 lbs since at [name]. We talked about structure in daily schedule. We talked about psychiatry consult for behaviors as Elaine. Potter with visual and auditory hallucinations. We talked about medical goals. We talked about role pc in poc. We talked about quality of life. Therapeutic listening, emotional  support provided. We talked about Hospice benefit under Medicare. Discussed will get labs to see about protein calorie malnutrition as with slight weight gain, though continues to have severe muscle wasting. [name] and [name] aligned, they left facility to take Elaine. Heinkel home. Elaine. Clyatt and I talked. Elaine. Zollinger made Elaine contact, difficulty following discussion, Elaine. Chacko was very distracted. She had her lunch in front of her, taking a bite of food, getting up from the table, interacting with other residents. Difficulty keeping on task. No meaningful discussion with cognitive impairment. Elaine. Lapiana was cooperative with assessment. Emotional support provided. I updated staff, requested labs and Psychiatry consult.   History obtained from review of EMR, discussion with primary team, and interview with family, facility staff/caregiver and/or Elaine. Hughett.  I reviewed available labs, medications, imaging, studies and related documents from the EMR.  Records reviewed and summarized above.   ROS Full 10 system review of systems performed and negative with exception of: as per HPI.   Physical Exam: Constitutional: NAD General: frail appearing, thin, pleasantly confused female EYES: lids intact ENMT:oral mucous membranes moist CV: S1S2, RRR, Pulmonary: LCTA, no increased work of breathing, no cough, room air Abdomen: normo-active BS + 4 quadrants, soft and non tender MSK: ambulatory; muscle wasting Skin: warm and dry Neuro:  + generalized weakness, + cognitive impairment Psych: non-anxious affect, A and Oriented to self  CURRENT PROBLEM LIST:  Patient Active Problem List   Diagnosis Date Noted   Hip fracture (  [facility]) 08/08/2016   Melena 03/05/2016   Absolute anemia 03/05/2016   TIA (transient ischemic attack) 02/29/2016   High cholesterol 02/29/2016   PAST MEDICAL HISTORY:  Active Ambulatory Problems    Diagnosis Date Noted   TIA (transient ischemic attack) 02/29/2016   High cholesterol  02/29/2016   Melena 03/05/2016   Absolute anemia 03/05/2016   Hip fracture ([facility]) 08/08/2016   Resolved Ambulatory Problems    Diagnosis Date Noted   No Resolved Ambulatory Problems   Past Medical History:  Diagnosis Date   Arthritis    Dementia ([facility])    Stroke ([facility])    SOCIAL HX:  Social History   Tobacco Use   Smoking status: Never   Smokeless tobacco: Never  Substance Use Topics   Alcohol use: No   FAMILY HX: History reviewed. No pertinent family history.  reviewed  ALLERGIES: No Known Allergies   PERTINENT MEDICATIONS:  Outpatient Encounter Medications as of 02/09/2021  Medication Sig   dipyridamole-aspirin (AGGRENOX) 200-25 MG 12hr capsule Take 1 capsule by mouth 2 (two) times daily.   donepezil (ARICEPT) 5 MG tablet Take 5 mg by mouth at bedtime.   rosuvastatin (CRESTOR) 5 MG tablet Take 5 mg by mouth daily.   traMADol (ULTRAM) 50 MG tablet Take 1 tablet (50 mg total) by mouth every 8 (eight) hours as needed for moderate pain. (Patient not taking: No sig reported)   No facility-administered encounter medications on file as of 02/09/2021.  Questions and concerns were addressed. The patient/family was encouraged to call with questions and/or concerns. My business card was provided. Provided general support and encouragement, no other unmet needs identified  Thank you for the opportunity to participate in the care of Elaine. Olmeda.  The palliative care team will continue to follow. Please call our office at [phone] if we can be of additional assistance.   This chart was dictated using voice recognition software.  Despite best efforts to proofread,  errors can occur which can change the documentation meaning.   Marguis Mathieson Z Donald Jacque, NP ,

## 2021-03-16 ENCOUNTER — Non-Acute Institutional Stay: Payer: Medicare Other | Admitting: Nurse Practitioner

## 2021-03-16 ENCOUNTER — Encounter: Payer: Self-pay | Admitting: Nurse Practitioner

## 2021-03-16 DIAGNOSIS — Z515 Encounter for palliative care: Secondary | ICD-10-CM

## 2021-03-16 DIAGNOSIS — F02818 Dementia in other diseases classified elsewhere, unspecified severity, with other behavioral disturbance: Secondary | ICD-10-CM

## 2021-03-16 DIAGNOSIS — F03911 Unspecified dementia, unspecified severity, with agitation: Secondary | ICD-10-CM

## 2021-03-16 DIAGNOSIS — R630 Anorexia: Secondary | ICD-10-CM

## 2021-03-16 NOTE — Progress Notes (Addendum)
Therapist, nutritional Palliative Care Consult Note Telephone: [phone]  Fax: [phone]    Date of encounter: 03/16/21 8:27 PM PATIENT NAME: [name] [address] [place] [address]   [phone] (home)  DOB: [birth year] MRN: [id number] PRIMARY CARE PROVIDER:    [facility]  RESPONSIBLE PARTY:    Contact Information     Name Relation Home Work Mobile   [name] Daughter [phone]     Luhmann,Calvin Spouse [phone]     Loftis,Kay Daughter   [phone]      I met face to face with patient in facility. Palliative Care was asked to follow this patient by consultation request of [name] to address advance care planning and complex medical decision making. This is a follow up visit.                                  ASSESSMENT AND PLAN / RECOMMENDATIONS:  Symptom Management/Plan: 1. Advance Care Planning; DNR; Discussed goals with 2 daughters   2. 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    3. Agitation secondary to dementia improved per daughters, staff as [name] has settled into locked memory care unit. Will continue to monitor; continue to recommend psychiatry to follow; redirectable.    4. Palliative care encounter; Palliative care encounter; Palliative medicine team will continue to support patient, patient's family, and medical team. Visit consisted of counseling and education dealing with the complex and emotionally intense issues of symptom management and palliative care in the setting of serious and potentially life-threatening illness   5. f/u 2 month for ongoing monitoring chronic disease progression, ongoing discussions complex medical decision making  Follow up Palliative Care Visit: Palliative care will continue to follow for complex medical decision  making, advance care planning, and clarification of goals. Return 8 weeks or prn.  I spent 63 minutes providing this consultation. More than 50% of the time in this consultation was spent in counseling and care coordination. PPS: 40%  Chief Complaint: Follow up palliative consult for complex medical decision making  HISTORY OF PRESENT ILLNESS:  [name] is a 87 y.o. year old female  with multiple medical problems including dementia with behaviors, TIA's, Stroke, arthritis, HLD, anemia, h/o hip fx. Staff endorses [name] does reside at [facility]. [name] does ambulate with no recent falls. [name] does require family/staff to perform her bath. Staff does have to assist with prompting, instructions to get dressed to moderate assistance. [name] has episodes of incontinence. [name] does feed herself. Takes prompting by staff get her to eat as she is easily distracted. No wounds, recent infections, hospitalizations. Staff endorses [name] is very interactive with staff, other residence. I visited [name] in her room. [name] was walking with her 2 daughters, [name] and [name] with husband sitting in the chair. We talked about how [name] has been doing. [name] endorses she is doing well, no further trouble with behaviors, seems to have settled in. We talked about ros, symptoms, appetite which continues to improve with weight gain. We talked about no recent falls, infections. We talked about functional, cognitive changes. [name] was very interactive, makes eye contact, asks questions. We talked about chronic disease progression. [name] was  cooperative with assessment. Medical goals reviewed, no other changes recommended; Therapeutic listening, emotional support provided. Questions answered. I updated staff.   History obtained from review of EMR, discussion with Facility staff and  [name].  I reviewed available labs, medications,  imaging, studies and related documents from the EMR.  Records reviewed and summarized above.   ROS Full 10 system review of systems performed and negative with exception of: as per HPI  Physical Exam: Constitutional: NAD General: frail appearing, thin cognitively impaired female EYES: lids intact ENMT: oral mucous membranes moist CV: S1S2, RRR Pulmonary: LCTA, no increased work of breathing, no cough, room air Abdomen: normo-active BS + 4 quadrants, soft and non tender MSK: ambulatory Skin: warm and dry Neuro:  no generalized weakness,  +cognitive impairment Psych: non-anxious affect, A and Oriented to self Questions and concerns were addressed.  Provided general support and encouragement, no other unmet needs identified   Thank you for the opportunity to participate in the care of [name].  The palliative care team will continue to follow. Please call our office at [phone] if we can be of additional assistance.   This chart was dictated using voice recognition software.  Despite best efforts to proofread,  errors can occur which can change the documentation meaning.   Kynzli Rease Ihor Gully, NP

## 2021-03-19 ENCOUNTER — Other Ambulatory Visit: Payer: Self-pay

## 2021-04-11 DIAGNOSIS — R627 Adult failure to thrive: Secondary | ICD-10-CM | POA: Diagnosis not present

## 2021-04-11 DIAGNOSIS — F01511 Vascular dementia, unspecified severity, with agitation: Secondary | ICD-10-CM | POA: Diagnosis not present

## 2021-04-11 DIAGNOSIS — E039 Hypothyroidism, unspecified: Secondary | ICD-10-CM | POA: Diagnosis not present

## 2021-04-11 DIAGNOSIS — G309 Alzheimer's disease, unspecified: Secondary | ICD-10-CM | POA: Diagnosis not present

## 2021-07-12 ENCOUNTER — Other Ambulatory Visit: Payer: Self-pay

## 2021-07-12 ENCOUNTER — Emergency Department (HOSPITAL_COMMUNITY): Payer: Medicare Other

## 2021-07-12 ENCOUNTER — Encounter (HOSPITAL_COMMUNITY): Payer: Self-pay | Admitting: Emergency Medicine

## 2021-07-12 ENCOUNTER — Emergency Department (HOSPITAL_COMMUNITY)
Admission: EM | Admit: 2021-07-12 | Discharge: 2021-07-12 | Disposition: A | Discharge status: Skilled Nursing Facility | Payer: Medicare Other | Attending: Emergency Medicine | Admitting: Emergency Medicine

## 2021-07-12 DIAGNOSIS — F0390 Unspecified dementia without behavioral disturbance: Secondary | ICD-10-CM | POA: Diagnosis not present

## 2021-07-12 DIAGNOSIS — Y92129 Unspecified place in nursing home as the place of occurrence of the external cause: Secondary | ICD-10-CM | POA: Insufficient documentation

## 2021-07-12 DIAGNOSIS — W01198A Fall on same level from slipping, tripping and stumbling with subsequent striking against other object, initial encounter: Secondary | ICD-10-CM | POA: Diagnosis not present

## 2021-07-12 DIAGNOSIS — W19XXXA Unspecified fall, initial encounter: Secondary | ICD-10-CM

## 2021-07-12 DIAGNOSIS — S0990XA Unspecified injury of head, initial encounter: Secondary | ICD-10-CM | POA: Insufficient documentation

## 2021-07-12 NOTE — ED Triage Notes (Signed)
Pt arrives via [facility] from [facility] care unit after having a trip and fall and the facility hitting hte back of her head. Pt did not lose consciousness. EMS reports pt takes blood thinners but not noted on paperwork from [facility]. Pt is alert and disoriented at baseline  ?

## 2021-07-12 NOTE — ED Provider Notes (Signed)
?[facility] ?Provider Note ? ? ?CSN: [id number] ?Arrival date & time: 07/12/21  1356 ? ? LEVEL 5 CAVEAT: DEMENTIA ? ?History ? ?Chief Complaint  ?Patient presents with  ? Fall  ? ? ?Sakshi Sermons is a 88 y.o. female with history of dementia at the memory care unit who presents the emergency department with a fall.  Per EMS note, patient was visiting with her husband and lost her balance and fell backward.  She did strike her head.  She did not lose consciousness.  She does not take any blood thinners.  Patient denies any somatic complaints at this time. ? ? ?Fall ? ? ?  ? ?Home Medications ?Prior to Admission medications   ?Medication Sig Start Date End Date Taking? Authorizing Provider  ?acetaminophen (TYLENOL) 650 MG CR tablet Take 650 mg by mouth every 12 (twelve) hours.   Yes [provider]  ?carboxymethylcellulose (REFRESH PLUS) 0.5 % SOLN Place 2 drops into both eyes in the morning.   Yes [provider]  ?Cholecalciferol (VITAMIN D3) 10 MCG (400 UNIT) CAPS Take 2 capsules by mouth daily.   Yes [provider]  ?escitalopram (LEXAPRO) 10 MG tablet Take 10 mg by mouth daily. 06/04/21  Yes [provider]  ?loperamide (IMODIUM) 2 MG capsule Take 2 mg by mouth as needed for diarrhea or loose stools.   Yes [provider]  ?LORazepam (ATIVAN) 0.5 MG tablet Take 0.25 mg by mouth in the morning, at noon, and at bedtime. 07/05/21  Yes [provider]  ?Multiple Vitamins-Minerals (PRESERVISION AREDS 2) CAPS Take 1 capsule by mouth in the morning and at bedtime.   Yes [provider]  ?OLANZapine (ZYPREXA) 5 MG tablet Take 5 mg by mouth at bedtime.   Yes [provider]  ?QUEtiapine (SEROQUEL) 25 MG tablet Take 12.5-25 mg by mouth in the morning and at bedtime. 05/30/21  Yes [provider]  ?traMADol (ULTRAM) 50 MG tablet Take 1 tablet (50 mg total) by mouth every 8 (eight) hours as needed for moderate pain. ?Patient not  taking: Reported on 09/22/2020 08/11/16   Fritzi Mandes, MD  ?   ? ?Allergies    ?Patient has no known allergies.   ? ?Review of Systems   ?Review of Systems  ?All other systems reviewed and are negative. ? ?Physical Exam ?Updated Vital Signs ?BP (!) 166/78   Pulse 83   Temp 97.9 ?F (36.6 ?C) (Oral)   Resp 16   Ht '5\' 4"'$  (1.626 m)   SpO2 98%   BMI 16.31 kg/m?  ?Physical Exam ?Vitals and nursing note reviewed.  ?Constitutional:   ?   General: She is not in acute distress. ?   Appearance: Normal appearance.  ?HENT:  ?   Head: Normocephalic.  ?   Comments: Mild swelling at the occiput.  No open wounds.  No evidence of ecchymosis. ?Eyes:  ?   General:     ?   Right eye: No discharge.     ?   Left eye: No discharge.  ?Cardiovascular:  ?   Comments: Regular rate and rhythm.  S1/S2 are distinct without any evidence of murmur, rubs, or gallops.  Radial pulses are 2+ bilaterally.  Dorsalis pedis pulses are 2+ bilaterally.  No evidence of pedal edema. ?Pulmonary:  ?   Comments: Clear to auscultation bilaterally.  Normal effort.  No respiratory distress.  No evidence of wheezes, rales, or rhonchi heard throughout. ?Abdominal:  ?   General: Abdomen  is flat. Bowel sounds are normal. There is no distension.  ?   Tenderness: There is no abdominal tenderness. There is no guarding or rebound.  ?Musculoskeletal:     ?   General: Normal range of motion.  ?   Cervical back: Neck supple.  ?   Comments: Pelvis is stable nontender to palpation.  Leg length is equal and normal lie.  No internal/external rotation to indicate hip fracture  ?Skin: ?   General: Skin is warm and dry.  ?   Findings: No rash.  ?Neurological:  ?   General: No focal deficit present.  ?   Mental Status: She is alert. Mental status is at baseline. She is confused.  ?Psychiatric:     ?   Mood and Affect: Mood normal.     ?   Behavior: Behavior normal.  ? ? ?ED Results / Procedures / Treatments   ?Labs ?(all labs ordered are listed, but only abnormal results are  displayed) ?Labs Reviewed - No data to display ? ?EKG ?None ? ?Radiology ?CT Head Wo Contrast ? ?Result Date: 07/12/2021 ?CLINICAL DATA:  Fall EXAM: CT HEAD WITHOUT CONTRAST TECHNIQUE: Contiguous axial images were obtained from the base of the skull through the vertex without intravenous contrast. RADIATION DOSE REDUCTION: This exam was performed according to the departmental dose-optimization program which includes automated exposure control, adjustment of the mA and/or kV according to patient size and/or use of iterative reconstruction technique. COMPARISON:  Brain MRI 12/12/2017, CT head 11/20/2020 FINDINGS: Brain: There is no evidence of acute intracranial hemorrhage, extra-axial fluid collection, or acute infarct. There is moderate global parenchymal volume loss with prominence of the ventricular system and extra-axial CSF spaces, unchanged. The ventricles are stable in size. Patchy hypodensity in the subcortical and periventricular white matter is nonspecific but likely reflects sequela of chronic white matter microangiopathy. There is no mass lesion.  There is no mass effect or midline shift. Vascular: There is calcification of the bilateral cavernous ICAs. Skull: Normal. Negative for fracture or focal lesion. Sinuses/Orbits: The paranasal sinuses are clear. Bilateral lens implants are in place. The globes and orbits are otherwise unremarkable. Other: There is a right mastoid effusion, new since the prior study. IMPRESSION: 1. No acute intracranial pathology. 2. Right mastoid effusion. Electronically Signed   By: Valetta Mole M.D.   On: 07/12/2021 15:08  ? ?CT Cervical Spine Wo Contrast ? ?Result Date: 07/12/2021 ?CLINICAL DATA:  Fall EXAM: CT CERVICAL SPINE WITHOUT CONTRAST TECHNIQUE: Multidetector CT imaging of the cervical spine was performed without intravenous contrast. Multiplanar CT image reconstructions were also generated. RADIATION DOSE REDUCTION: This exam was performed according to the departmental  dose-optimization program which includes automated exposure control, adjustment of the mA and/or kV according to patient size and/or use of iterative reconstruction technique. COMPARISON:  Cervical spine radiographs 03/31/2007 FINDINGS: Alignment: There is grade 1 anterolisthesis of C4 on C5, likely degenerative in nature. There is no other antero or retrolisthesis. There is no jumped or perched facets or other evidence of traumatic malalignment. Skull base and vertebrae: Skull base alignment is maintained. Vertebral body heights are preserved. There is no evidence of acute fracture. Soft tissues and spinal canal: No prevertebral fluid or swelling. No visible canal hematoma. Disc levels: There is mild for age multilevel disc space narrowing. Facet arthropathy is most advanced on the right at C2-C3 and C3-C4. There is no high-grade spinal canal stenosis. Upper chest: There is prominent right apical scarring. Other: None. IMPRESSION: No acute fracture  or traumatic malalignment of the cervical spine. Electronically Signed   By: Valetta Mole M.D.   On: 07/12/2021 15:14   ? ?Procedures ?Procedures  ? ? ?Medications Ordered in ED ?Medications - No data to display ? ?ED Course/ Medical Decision Making/ A&P ?Clinical Course as of 07/12/21 1616  ?Thu Jul 12, 2021  ?1607 I discussed this case with my attending physician who cosigned this note including patient's presenting symptoms, physical exam, and planned diagnostics and interventions. Attending physician stated agreement with plan or made changes to plan which were implemented.  ? ? [CF]  ?  ?Clinical Course User Index ?[CF] Myna Bright M, PA-C  ? ?                        ?Medical Decision Making ?Amount and/or Complexity of Data Reviewed ?Radiology: ordered. ? ? ?This patient presents to the ED for concern of intracranial hemorrhage after a fall and head injury, this involves an extensive number of treatment options, and is a complaint that carries with it a high  risk of complications and morbidity.  The differential diagnosis includes intracranial hemorrhage, cervical fracture, contusion. ? ? ?Co morbidities that complicate the patient evaluation ? ?Past Medical History:  ?

## 2021-07-12 NOTE — Discharge Instructions (Signed)
Please follow-up your primary care. ?

## 2021-07-12 NOTE — ED Notes (Signed)
Spoke with Ginger, staff member at [facility]. She confirmed that pt is not on any blood thinners. Baseline disorientation d/t dementia. She states pt walks independently with steady gait. No other falls in the past 3 months per staff.  ?

## 2021-07-25 ENCOUNTER — Non-Acute Institutional Stay: Payer: Medicare Other | Admitting: Nurse Practitioner

## 2021-07-25 ENCOUNTER — Encounter: Payer: Self-pay | Admitting: Nurse Practitioner

## 2021-07-25 DIAGNOSIS — R630 Anorexia: Secondary | ICD-10-CM

## 2021-07-25 DIAGNOSIS — F02818 Dementia in other diseases classified elsewhere, unspecified severity, with other behavioral disturbance: Secondary | ICD-10-CM

## 2021-07-25 DIAGNOSIS — Z515 Encounter for palliative care: Secondary | ICD-10-CM

## 2021-07-25 NOTE — Progress Notes (Signed)
? ? ?Manufacturing engineer ?Community Palliative Care Consult Note ?Telephone: [phone]  ?Fax: [phone]  ? ? ?Date of encounter: 07/25/21 ?7:42 PM ?PATIENT NAME: [name] ?[address] ?[place] [address]   ?[phone] (home)  ?DOB: 01-21-1933 ?MRN: [id number] ?PRIMARY CARE PROVIDER:    ?[facility] ? ?RESPONSIBLE PARTY:    ?Contact Information   ? ? Name Relation Home Work Mobile  ? Lanier,Jannet Daughter [phone]    ? Callan,Calvin Spouse [phone]    ? Loftis,Kay Daughter   [phone]  ? ?  ? ?I met face to face with patient and family in facility. Palliative Care was asked to follow this patient by consultation request of [facility] to address advance care planning and complex medical decision making. This is a follow up visit.                                  ?ASSESSMENT AND PLAN / RECOMMENDATIONS:  ?Symptom Management/Plan: ?1. Advance Care Planning; DNR;  ? ?2. Anorexia secondary to dementia continue to monitor weights; appetite; continues to have muscle wasting ?continue to encourage to eat.  ?06/30/2021 weight 110 lbs with 6 lbs weight gain ?  ?3.  Palliative care encounter; Palliative care encounter; Palliative medicine team will continue to support patient, patient's family, and medical team. Visit consisted of counseling and education dealing with the complex and emotionally intense issues of symptom management and palliative care in the setting of serious and potentially life-threatening illness ?  ?4. f/u 1 month for ongoing monitoring chronic disease progression, ongoing discussions complex medical decision making ?Follow up Palliative Care Visit: Palliative care will continue to follow for complex medical decision making, advance care planning, and clarification of goals. Return 4 weeks or prn. ? ?I spent 43 minutes providing this consultation starting at 1:30pm. More than 50% of the time in this consultation was spent in counseling and care  coordination. ?PPS: 40% ? ?Chief Complaint: Follow up palliative consult for complex medical decision making ? ?HISTORY OF PRESENT ILLNESS:  [name] is a 88 y.o. year old female  with multiple medical problems including dementia with behaviors, TIA's, Stroke, arthritis, HLD, anemia, h/o hip fx. Staff endorses [name] does reside at [facility] unit. [name] does ambulate with recent fall 07/12/2021 seen in ED.  [name] does require family/staff to perform her bath. Staff does have to assist with prompting, instructions to get dressed to moderate assistance. [name] has episodes of incontinence. [name] does feed herself improved with 6 lb weight gain. No wounds, recent infections. Staff endorses [name] is very interactive with staff, other residence. Discussed with Lerry Liner NP concerning abdominal pain she was being worked up as well as agitation which she was going to stop seroquel and start zyprexa. I visited [name] in her room. [name] was sitting in the chair in her room with her husband at her side. We talked about how [name] has been doing. We talked about appetite which continues to improve with weight gain. We talked about recent falls, infections. We talked about functional, cognitive changes. [name] was very interactive, makes eye contact, asks questions. We talked about dementia.  [name] was cooperative with assessment, smiling, making eye contact, repeating same words though trying to be interactive. Medical goals reviewed, no other changes recommended; Therapeutic listening, emotional support provided. Questions answered. I updated  staff.  .  ? ?History obtained from review of EMR, discussion with primary team, facility staff/caregiver and/or [name].  ?I reviewed available labs, medications, imaging, studies and related documents from the EMR.  Records reviewed and summarized above.  ? ?ROS ?ROS per staff as Ms.  Potter is cognitively impaired  ?General: NAD ?EYES: denies vision changes ?ENMT: denies dysphagia ?Cardiovascular: denies chest pain, denies DOE ?Pulmonary: denies cough, denies increased SOB ?Abdomen: endorses good appetite, denies constipation, endorses continence of bowel ?GU: denies dysuria, endorses continence of urine ?MSK:  denies increased weakness,  no falls reported ?Skin: denies rashes or wounds ?Neurological: denies pain, denies insomnia ?Psych: Endorses positive mood ? ?Physical Exam: ?Constitutional: NAD ?General: frail appearing, thin, pleasantly  ?EYES: lids intact ?ENMT: intact hearing, oral mucous membranes moist ?CV: S1S2, RRR ?Pulmonary: LCTA, no increased work of breathing, no cough, room air ?Abdomen: normo-active BS + 4 quadrants, soft and non tender ?MSK: ambulatory with walker; +muscle wasting ?Skin: warm and dry ?Neuro:  no generalized weakness,  + cognitive impairment ?Psych: non-anxious affect, A and Oriented to self ?Thank you for the opportunity to participate in the care of [name].  The palliative care team will continue to follow. Please call our office at [phone] if we can be of additional assistance.  ? ?Mert Dietrick Z Haide Klinker, NP  ?  ? ?

## 2021-08-01 ENCOUNTER — Other Ambulatory Visit: Payer: Self-pay

## 2021-08-01 ENCOUNTER — Emergency Department (HOSPITAL_COMMUNITY): Payer: Medicare Other

## 2021-08-01 ENCOUNTER — Emergency Department (HOSPITAL_COMMUNITY)
Admission: EM | Admit: 2021-08-01 | Discharge: 2021-08-01 | Disposition: A | Discharge status: Home / Self Care | Payer: Medicare Other | Attending: Emergency Medicine | Admitting: Emergency Medicine

## 2021-08-01 ENCOUNTER — Encounter (HOSPITAL_COMMUNITY): Payer: Self-pay

## 2021-08-01 DIAGNOSIS — F0390 Unspecified dementia without behavioral disturbance: Secondary | ICD-10-CM | POA: Insufficient documentation

## 2021-08-01 DIAGNOSIS — K449 Diaphragmatic hernia without obstruction or gangrene: Secondary | ICD-10-CM | POA: Diagnosis not present

## 2021-08-01 DIAGNOSIS — D72829 Elevated white blood cell count, unspecified: Secondary | ICD-10-CM | POA: Insufficient documentation

## 2021-08-01 DIAGNOSIS — R1114 Bilious vomiting: Secondary | ICD-10-CM | POA: Diagnosis not present

## 2021-08-01 DIAGNOSIS — Z96642 Presence of left artificial hip joint: Secondary | ICD-10-CM | POA: Insufficient documentation

## 2021-08-01 DIAGNOSIS — J90 Pleural effusion, not elsewhere classified: Secondary | ICD-10-CM | POA: Diagnosis not present

## 2021-08-01 DIAGNOSIS — R109 Unspecified abdominal pain: Secondary | ICD-10-CM | POA: Diagnosis not present

## 2021-08-01 LAB — CBC WITH DIFFERENTIAL/PLATELET
Abs Immature Granulocytes: 0.07 10*3/uL (ref 0.00–0.07)
Basophils Absolute: 0.1 10*3/uL (ref 0.0–0.1)
Basophils Relative: 1 %
Eosinophils Absolute: 0.1 10*3/uL (ref 0.0–0.5)
Eosinophils Relative: 1 %
HCT: 37.6 % (ref 36.0–46.0)
Hemoglobin: 12.2 g/dL (ref 12.0–15.0)
Immature Granulocytes: 1 %
Lymphocytes Relative: 9 %
Lymphs Abs: 1 10*3/uL (ref 0.7–4.0)
MCH: 32.9 pg (ref 26.0–34.0)
MCHC: 32.4 g/dL (ref 30.0–36.0)
MCV: 101.3 fL — ABNORMAL HIGH (ref 80.0–100.0)
Monocytes Absolute: 1 10*3/uL (ref 0.1–1.0)
Monocytes Relative: 9 %
Neutro Abs: 9.1 10*3/uL — ABNORMAL HIGH (ref 1.7–7.7)
Neutrophils Relative %: 79 %
Platelets: 419 10*3/uL — ABNORMAL HIGH (ref 150–400)
RBC: 3.71 MIL/uL — ABNORMAL LOW (ref 3.87–5.11)
RDW: 12.5 % (ref 11.5–15.5)
WBC: 11.3 10*3/uL — ABNORMAL HIGH (ref 4.0–10.5)
nRBC: 0 % (ref 0.0–0.2)

## 2021-08-01 LAB — URINALYSIS, ROUTINE W REFLEX MICROSCOPIC
Bilirubin Urine: NEGATIVE
Glucose, UA: NEGATIVE mg/dL
Hgb urine dipstick: NEGATIVE
Ketones, ur: 5 mg/dL — AB
Leukocytes,Ua: NEGATIVE
Nitrite: NEGATIVE
Protein, ur: NEGATIVE mg/dL
Specific Gravity, Urine: 1.043 — ABNORMAL HIGH (ref 1.005–1.030)
pH: 6 (ref 5.0–8.0)

## 2021-08-01 LAB — COMPREHENSIVE METABOLIC PANEL
ALT: 38 U/L (ref 0–44)
AST: 34 U/L (ref 15–41)
Albumin: 3.3 g/dL — ABNORMAL LOW (ref 3.5–5.0)
Alkaline Phosphatase: 65 U/L (ref 38–126)
Anion gap: 9 (ref 5–15)
BUN: 31 mg/dL — ABNORMAL HIGH (ref 8–23)
CO2: 25 mmol/L (ref 22–32)
Calcium: 9.4 mg/dL (ref 8.9–10.3)
Chloride: 102 mmol/L (ref 98–111)
Creatinine, Ser: 1.01 mg/dL — ABNORMAL HIGH (ref 0.44–1.00)
GFR, Estimated: 54 mL/min — ABNORMAL LOW (ref >60)
Glucose, Bld: 103 mg/dL — ABNORMAL HIGH (ref 70–99)
Potassium: 4.4 mmol/L (ref 3.5–5.1)
Sodium: 136 mmol/L (ref 135–145)
Total Bilirubin: 0.3 mg/dL (ref 0.3–1.2)
Total Protein: 7 g/dL (ref 6.5–8.1)

## 2021-08-01 LAB — LIPASE, BLOOD: Lipase: 25 U/L (ref 11–51)

## 2021-08-01 MED ORDER — ONDANSETRON 4 MG PO TBDP: 4.0000 mg | ORAL_TABLET | Freq: Three times a day (TID) | ORAL | 0 refills | Status: AC | PRN

## 2021-08-01 MED ORDER — IOHEXOL 300 MG/ML  SOLN
100.0000 mL | Freq: Once | INTRAMUSCULAR | Status: AC | PRN
Start: 2021-08-01 — End: 2021-08-01
  Administered 2021-08-01: 75 mL via INTRAVENOUS

## 2021-08-01 NOTE — ED Triage Notes (Signed)
Pt here from [place] via [facility]; pt sent for c/o vomiting bile x one week; pt was on antibiotic for unknown reason  ?

## 2021-08-01 NOTE — ED Provider Notes (Signed)
?[facility] ?Provider Note ? ? ?CSN: [id number] ?Arrival date & time: 08/01/21  1155 ? ?  ? ?History ? ?Chief Complaint  ?Patient presents with  ? Abdominal Pain  ? ? ?[name] is a 88 y.o. female. ? ?Patient brought in by [facility] from [place].  They report the patient has had vomiting bilious in nature for 1 week.  Mention something about being on antibiotic for unknown reasons.  Patient's temperature on arrival 98.5 blood pressure 128/68.  Oxygen sats 100%.  Patient appears in no acute distress.  Patient is known to have dementia.  Past medical history significant for stroke high cholesterol and dementia.  Past surgical history significant for appendectomy.  And hip arthroplasty in May 2018. ? ? ?  ? ?Home Medications ?Prior to Admission medications   ?Medication Sig Start Date End Date Taking? Authorizing Provider  ?acetaminophen (TYLENOL) 650 MG CR tablet Take 650 mg by mouth every 12 (twelve) hours.   Yes [provider]  ?carboxymethylcellulose (REFRESH PLUS) 0.5 % SOLN Place 2 drops into both eyes in the morning.   Yes [provider]  ?Cholecalciferol (VITAMIN D3) 10 MCG (400 UNIT) CAPS Take 2 capsules by mouth daily.   Yes [provider]  ?escitalopram (LEXAPRO) 10 MG tablet Take 10 mg by mouth daily. 06/04/21  Yes [provider]  ?loperamide (IMODIUM) 2 MG capsule Take 2 mg by mouth as needed for diarrhea or loose stools.   Yes [provider]  ?LORazepam (ATIVAN) 0.5 MG tablet Take 0.25 mg by mouth in the morning, at noon, and at bedtime. 07/05/21  Yes [provider]  ?Multiple Vitamins-Minerals (PRESERVISION AREDS 2) CAPS Take 1 capsule by mouth in the morning and at bedtime.   Yes [provider]  ?OLANZapine (ZYPREXA) 5 MG tablet Take 5 mg by mouth at bedtime.   Yes [provider]  ?sulfamethoxazole-trimethoprim (BACTRIM DS) 800-160 MG tablet Take 1 tablet by mouth 2 (two) times daily.  07/26/21  Yes [provider]  ?QUEtiapine (SEROQUEL) 25 MG tablet Take 12.5-25 mg by mouth in the morning and at bedtime. ?Patient not taking: Reported on 08/01/2021 05/30/21   [provider]  ?traMADol (ULTRAM) 50 MG tablet Take 1 tablet (50 mg total) by mouth every 8 (eight) hours as needed for moderate pain. ?Patient not taking: Reported on 09/22/2020 08/11/16   Fritzi Mandes, MD  ?   ? ?Allergies    ?Patient has no known allergies.   ? ?Review of Systems   ?Review of Systems  ?Unable to perform ROS: Dementia  ? ?Physical Exam ?Updated Vital Signs ?BP 106/82   Pulse 86   Temp 98.5 ?F (36.9 ?C) (Oral)   Resp 17   Ht 1.626 m ('5\' 4"'$ )   Wt 40.8 kg   SpO2 100%   BMI 15.45 kg/m?  ?Physical Exam ?Vitals and nursing note reviewed.  ?Constitutional:   ?   General: She is not in acute distress. ?   Appearance: She is well-developed.  ?HENT:  ?   Head: Normocephalic and atraumatic.  ?Eyes:  ?   Conjunctiva/sclera: Conjunctivae normal.  ?   Pupils: Pupils are equal, round, and reactive to light.  ?Cardiovascular:  ?   Rate and Rhythm: Normal rate and regular rhythm.  ?   Heart sounds: No murmur heard. ?Pulmonary:  ?   Effort: Pulmonary effort is normal. No respiratory distress.  ?   Breath sounds: Normal breath sounds.  ?Abdominal:  ?  General: Abdomen is flat.  ?   Palpations: Abdomen is soft.  ?   Tenderness: There is no abdominal tenderness.  ?   Hernia: No hernia is present.  ?Musculoskeletal:     ?   General: No swelling.  ?   Cervical back: Neck supple.  ?Skin: ?   General: Skin is warm and dry.  ?   Capillary Refill: Capillary refill takes less than 2 seconds.  ?Neurological:  ?   Mental Status: She is alert.  ?   Comments: Patient with a history of dementia appears to be at baseline  ?Psychiatric:     ?   Mood and Affect: Mood normal.  ? ? ?ED Results / Procedures / Treatments   ?Labs ?(all labs ordered are listed, but only abnormal results are displayed) ?Labs Reviewed  ?COMPREHENSIVE METABOLIC  PANEL - Abnormal; Notable for the following components:  ?    Result Value  ? Glucose, Bld 103 (*)   ? BUN 31 (*)   ? Creatinine, Ser 1.01 (*)   ? Albumin 3.3 (*)   ? GFR, Estimated 54 (*)   ? All other components within normal limits  ?CBC WITH DIFFERENTIAL/PLATELET - Abnormal; Notable for the following components:  ? WBC 11.3 (*)   ? RBC 3.71 (*)   ? MCV 101.3 (*)   ? Platelets 419 (*)   ? Neutro Abs 9.1 (*)   ? All other components within normal limits  ?LIPASE, BLOOD  ?URINALYSIS, ROUTINE W REFLEX MICROSCOPIC  ? ? ?EKG ?None ? ?Radiology ?CT Abdomen Pelvis W Contrast ? ?Result Date: 08/01/2021 ?CLINICAL DATA:  Vomiting bile for 1 week. Abdominal pain. Dementia. Poor historian. EXAM: CT ABDOMEN AND PELVIS WITH CONTRAST TECHNIQUE: Multidetector CT imaging of the abdomen and pelvis was performed using the standard protocol following bolus administration of intravenous contrast. RADIATION DOSE REDUCTION: This exam was performed according to the departmental dose-optimization program which includes automated exposure control, adjustment of the mA and/or kV according to patient size and/or use of iterative reconstruction technique. CONTRAST:  54m OMNIPAQUE IOHEXOL 300 MG/ML  SOLN COMPARISON:  None Available. FINDINGS: Minimal motion degradation throughout. Lower chest: Dependent right lower lobe atelectasis. Small right pleural effusion. Small hiatal hernia with mild distal esophageal wall thickening. Hepatobiliary: Normal liver. No calcified gallstones. No intra or extrahepatic biliary duct dilatation. Possible tiny foci of increased density within the common duct including on 22/2 and coronal image 37. Pancreas: Normal pancreas for age. No duct dilatation or acute inflammation. Spleen: Normal in size, without focal abnormality. Adrenals/Urinary Tract: Normal adrenal glands. Interpolar right renal 2.1 cm cyst . No f/up imaging recommended. Normal left kidney. No hydronephrosis. The bladder is mildly distended.  Degraded evaluation of the pelvis, secondary to beam hardening artifact from left hip arthroplasty. Stomach/Bowel: Normal remainder of the stomach. Colonic stool burden suggests constipation. Normal terminal ileum. Prior appendectomy per EMR. Normal small bowel. Vascular/Lymphatic: Advanced aortic and branch vessel atherosclerosis. No abdominal a no gross pelvic adenopathy. Reproductive: Grossly normal uterus.  No adnexal mass. Other: No significant free fluid.  No free intraperitoneal air. Musculoskeletal: Left hip arthroplasty. Osteopenia. Remote left inferior and likely superior pubic ramus fractures. Compression deformities are moderate L5, L4 and T12. Ventral canal encroachment is most significant at T12. IMPRESSION: 1. Mildly motion degraded exam. 2.  Possible constipation. 3. Equivocal increased density in the common duct. Consider correlation with bilirubin levels. Especially if elevated, consider MRCP to exclude choledocholithiasis. 4. Small right pleural effusion. 5. Small hiatal  hernia with distal esophageal wall thickening, suggesting esophagitis. 6. Mild bladder distension. 7. Osteopenia with thoracolumbar compression deformities. Electronically Signed   By: Abigail Miyamoto M.D.   On: 08/01/2021 14:50   ? ?Procedures ?Procedures  ? ? ?Medications Ordered in ED ?Medications  ?iohexol (OMNIPAQUE) 300 MG/ML solution 100 mL (75 mLs Intravenous Contrast Given 08/01/21 1427)  ? ? ?ED Course/ Medical Decision Making/ A&P ?  ?                        ?Medical Decision Making ?Amount and/or Complexity of Data Reviewed ?Labs: ordered. ?Radiology: ordered. ? ?Risk ?Prescription drug management. ? ?Work-up to include CT scan abdomen and pelvis.  Lipase normal liver function tests normal.  GFR 54 some mild renal insufficiency.  Electrolytes normal.  Normal gap.  White blood cell count slightly elevated 11.3 hemoglobin 12.2. ? ?CT scan shows is possible constipation.  Equivocal increased density in the common bile duct  consider correlation with bilirubin levels but bilirubin is normal.  Could consider excluding choledocholithiasis.  Small right pleural effusion small hiatal hernia with some wall thickening suggesting esophagitis.  Mild bla

## 2021-08-01 NOTE — ED Notes (Signed)
Pt moved to room closer to Nurse Station due to altered mental status, pt is pulling at her monitors and attempting to get out of bed.  ? ? ?

## 2021-08-01 NOTE — Discharge Instructions (Addendum)
If you develop worsening, continued, or recurrent abdominal pain, uncontrolled vomiting, fever, chest or back pain, or any other new/concerning symptoms then return to the ER for evaluation.  

## 2021-08-01 NOTE — ED Provider Notes (Signed)
Patient's ultrasound shows no obvious cholecystitis, cholelithiasis, or CBD dilation.  I do not think she needs emergent MRCP at this point.  No current vomiting.  Will discharge.  I tried to call family listed in the chart and got no answer on both calls. ?  ?Sherwood Gambler, MD ?08/01/21 1803 ? ?

## 2021-08-01 NOTE — ED Notes (Signed)
US at bedside

## 2021-10-11 ENCOUNTER — Emergency Department (HOSPITAL_COMMUNITY)
Admission: EM | Admit: 2021-10-11 | Discharge: 2021-10-12 | Disposition: A | Discharge status: Skilled Nursing Facility | Payer: Medicare Other | Attending: Emergency Medicine | Admitting: Emergency Medicine

## 2021-10-11 ENCOUNTER — Other Ambulatory Visit: Payer: Self-pay

## 2021-10-11 ENCOUNTER — Emergency Department (HOSPITAL_COMMUNITY): Payer: Medicare Other

## 2021-10-11 ENCOUNTER — Encounter (HOSPITAL_COMMUNITY): Payer: Self-pay | Admitting: *Deleted

## 2021-10-11 DIAGNOSIS — R739 Hyperglycemia, unspecified: Secondary | ICD-10-CM | POA: Diagnosis not present

## 2021-10-11 DIAGNOSIS — W050XXA Fall from non-moving wheelchair, initial encounter: Secondary | ICD-10-CM | POA: Diagnosis not present

## 2021-10-11 DIAGNOSIS — S0083XA Contusion of other part of head, initial encounter: Secondary | ICD-10-CM | POA: Insufficient documentation

## 2021-10-11 DIAGNOSIS — W19XXXA Unspecified fall, initial encounter: Secondary | ICD-10-CM

## 2021-10-11 DIAGNOSIS — S0990XA Unspecified injury of head, initial encounter: Secondary | ICD-10-CM | POA: Diagnosis present

## 2021-10-11 NOTE — ED Triage Notes (Signed)
Pt brought in by [facility] for c/o witnessed fall;  pt fell out of wheelchair landing on right side of her head  Pt has purple bruise to right side of head  Pt unable to give any hx, has hx of dementia

## 2021-10-11 NOTE — ED Provider Notes (Signed)
[facility] EMERGENCY DEPARTMENT Provider Note   CSN: [id number] Arrival date & time: 10/11/21  1519     History  Chief Complaint  Patient presents with   Fall    [name] is a 88 y.o. female with history of dementia not currently on blood thinners who presents to the ED from [facility] after a witnessed fall.  Patient was reportedly reaching for something when she fell forward out of the chair and landed on the right side of her head.  They called EMS due to the bruising on the right side of her head.  Patient has severe dementia and is unable to answer any questions.  At the time of my initial evaluation, there is no family present who can provide additional history.   Fall       Home Medications Prior to Admission medications   Medication Sig Start Date End Date Taking? Authorizing Provider  acetaminophen (TYLENOL) 650 MG CR tablet Take 650 mg by mouth every 12 (twelve) hours.    [provider]  carboxymethylcellulose (REFRESH PLUS) 0.5 % SOLN Place 2 drops into both eyes in the morning.    [provider]  Cholecalciferol (VITAMIN D3) 10 MCG (400 UNIT) CAPS Take 2 capsules by mouth daily.    [provider]  escitalopram (LEXAPRO) 10 MG tablet Take 10 mg by mouth daily. 06/04/21   [provider]  loperamide (IMODIUM) 2 MG capsule Take 2 mg by mouth as needed for diarrhea or loose stools.    [provider]  LORazepam (ATIVAN) 0.5 MG tablet Take 0.25 mg by mouth in the morning, at noon, and at bedtime. 07/05/21   [provider]  Multiple Vitamins-Minerals (PRESERVISION AREDS 2) CAPS Take 1 capsule by mouth in the morning and at bedtime.    [provider]  OLANZapine (ZYPREXA) 5 MG tablet Take 5 mg by mouth at bedtime.    [provider]  ondansetron (ZOFRAN-ODT) 4 MG disintegrating tablet Take 1 tablet (4 mg total) by mouth every 8 (eight) hours as needed for nausea or vomiting. 08/01/21   Sherwood Gambler, MD  QUEtiapine (SEROQUEL) 25 MG tablet Take 12.5-25 mg by mouth in the morning and at bedtime. Patient not taking: Reported on 08/01/2021 05/30/21   [provider]  sulfamethoxazole-trimethoprim (BACTRIM DS) 800-160 MG tablet Take 1 tablet by mouth 2 (two) times daily. 07/26/21   [provider]  traMADol (ULTRAM) 50 MG tablet Take 1 tablet (50 mg total) by mouth every 8 (eight) hours as needed for moderate pain. Patient not taking: Reported on 09/22/2020 08/11/16   Fritzi Mandes, MD      Allergies    Patient has no known allergies.    Review of Systems   Review of Systems  Physical Exam Updated Vital Signs BP 98/61 (BP Location: Left Arm)   Pulse 88   Temp 97.8 F (36.6 C) (Oral)   Resp 16   Ht '5\' 4"'$  (1.626 m)   Wt 40.8 kg   SpO2 100%   BMI 15.44 kg/m  Physical Exam Vitals and nursing note reviewed.  Constitutional:      General: She is not in acute distress.    Appearance: She is not ill-appearing.  HENT:     Head: Atraumatic.     Comments: Mild bruising noted to the right temporal region.  Negative raccoon eyes, negative battle sign Eyes:     Extraocular Movements: Extraocular movements intact.     Conjunctiva/sclera: Conjunctivae normal.  Pupils: Pupils are equal, round, and reactive to light.  Cardiovascular:     Rate and Rhythm: Normal rate and regular rhythm.     Pulses: Normal pulses.     Heart sounds: No murmur heard. Pulmonary:     Effort: Pulmonary effort is normal. No respiratory distress.     Breath sounds: Normal breath sounds.  Abdominal:     General: Abdomen is flat. There is no distension.     Palpations: Abdomen is soft.     Tenderness: There is no abdominal tenderness.  Musculoskeletal:        General: Normal range of motion.     Cervical back: Normal range of motion.  Skin:    General: Skin is warm and dry.     Capillary Refill: Capillary refill takes less than 2 seconds.  Neurological:     Mental Status: She is alert.  Mental status is at baseline.  Psychiatric:        Mood and Affect: Mood normal.     ED Results / Procedures / Treatments   Labs (all labs ordered are listed, but only abnormal results are displayed) Labs Reviewed  CBG MONITORING, ED - Abnormal; Notable for the following components:      Result Value   Glucose-Capillary 116 (*)    All other components within normal limits    EKG None  Radiology CT Cervical Spine Wo Contrast  Result Date: 10/11/2021 CLINICAL DATA:  Trauma. EXAM: CT CERVICAL SPINE WITHOUT CONTRAST TECHNIQUE: Multidetector CT imaging of the cervical spine was performed without intravenous contrast. Multiplanar CT image reconstructions were also generated. RADIATION DOSE REDUCTION: This exam was performed according to the departmental dose-optimization program which includes automated exposure control, adjustment of the mA and/or kV according to patient size and/or use of iterative reconstruction technique. COMPARISON:  Cervical spine CT 07/12/2021 FINDINGS: Alignment: There is 2 mm of anterolisthesis at C4-C5. Alignment is otherwise anatomic. Skull base and vertebrae: No acute fracture. No primary bone lesion or focal pathologic process. Soft tissues and spinal canal: No prevertebral fluid or swelling. No visible canal hematoma. Disc levels: There is disc space narrowing and endplate osteophyte formation throughout the cervical spine. There also scattered degenerative changes of facet joints, mainly on the right. There is no severe central canal or neural foraminal stenosis at any level. Upper chest: There is scarring in both lung apices. There are bilateral calcified pleural plaques which can be seen with prior asbestos exposure. Other: None. IMPRESSION: No acute fracture or traumatic subluxation of the cervical spine. Electronically Signed   By: Ronney Asters M.D.   On: 10/11/2021 17:25   CT Head Wo Contrast  Result Date: 10/11/2021 CLINICAL DATA:  Fall.  Head trauma, minor  (Age >= 65y) EXAM: CT HEAD WITHOUT CONTRAST TECHNIQUE: Contiguous axial images were obtained from the base of the skull through the vertex without intravenous contrast. RADIATION DOSE REDUCTION: This exam was performed according to the departmental dose-optimization program which includes automated exposure control, adjustment of the mA and/or kV according to patient size and/or use of iterative reconstruction technique. COMPARISON:  None Available. FINDINGS: Brain: There is atrophy and chronic small vessel disease changes. No acute intracranial abnormality. Specifically, no hemorrhage, hydrocephalus, mass lesion, acute infarction, or significant intracranial injury. Vascular: No hyperdense vessel or unexpected calcification. Skull: No acute calvarial abnormality. Sinuses/Orbits: Air-fluid levels in the maxillary sinuses bilaterally. Other: None IMPRESSION: Atrophy, chronic microvascular disease. No acute intracranial abnormality. Acute maxillary sinusitis. Electronically Signed   By: Lennette Bihari  [place] M.D.   On: 10/11/2021 17:24    Procedures Procedures    Medications Ordered in ED Medications - No data to display  ED Course/ Medical Decision Making/ A&P                           Medical Decision Making Amount and/or Complexity of Data Reviewed Radiology: ordered.   88 year old female presents to the ED for evaluation of a head injury that occurred at [facility] earlier today.  Vitals without significant abnormality.  She is alert and in no acute distress although she is unable to answer questions given the degree of her dementia.  I ordered CT scan of head and C-spine and was without acute fracture or bleed.  During my reevaluation, patient is now accompanied by family members including her daughter who is power of attorney.  Patient's daughter states that patient is currently on hospice and she is frustrated that patient was brought to the emergency department in the first place.  She states that  they purposely put patient on hospice to prevent unnecessary emergency department visits for small things that cause further disorientation and confusion.  Given that patient's CT head and C-spine were negative.  She is moving all other extremities without pain or discomfort and physical exam was benign, plan to discharge home.  Patient will need to wait until transport back to [facility] can be provided. Final Clinical Impression(s) / ED Diagnoses Final diagnoses:  Fall, initial encounter  Injury of head, initial encounter    Rx / DC Orders ED Discharge Orders     None         Rodena Piety 10/11/21 2330    Sherwood Gambler, MD 10/15/21 [phone]

## 2021-10-11 NOTE — Discharge Instructions (Addendum)
[name] CT scan was negative for bleeding in the brain or any fractures.  Feel free to return if she has any changes in symptoms or you want additional evaluation.

## 2021-10-15 LAB — CBG MONITORING, ED: Glucose-Capillary: 116 mg/dL — ABNORMAL HIGH (ref 70–99)

## 2022-01-30 DEATH — deceased
